# Patient Record
Sex: Female | Born: 1980
Health system: Southern US, Community
[De-identification: ages and names within clinical notes are randomized; demographics above are authoritative.]

## PROBLEM LIST (undated history)

## (undated) DIAGNOSIS — F419 Anxiety disorder, unspecified: Secondary | ICD-10-CM

## (undated) HISTORY — PX: NASAL SEPTUM SURGERY: SHX37

## (undated) HISTORY — DX: Anxiety disorder, unspecified: F41.9

## (undated) HISTORY — PX: CHOLECYSTECTOMY: SHX55

## (undated) HISTORY — PX: ANKLE SURGERY: SHX546

## (undated) HISTORY — PX: OTHER SURGICAL HISTORY: SHX169

---

## 2017-07-27 DIAGNOSIS — J029 Acute pharyngitis, unspecified: Secondary | ICD-10-CM | POA: Diagnosis not present

## 2017-09-16 DIAGNOSIS — J9801 Acute bronchospasm: Secondary | ICD-10-CM | POA: Diagnosis not present

## 2017-09-16 DIAGNOSIS — J00 Acute nasopharyngitis [common cold]: Secondary | ICD-10-CM | POA: Diagnosis not present

## 2017-09-16 DIAGNOSIS — J209 Acute bronchitis, unspecified: Secondary | ICD-10-CM | POA: Diagnosis not present

## 2017-10-20 ENCOUNTER — Ambulatory Visit (INDEPENDENT_AMBULATORY_CARE_PROVIDER_SITE_OTHER): Payer: 59 | Admitting: Obstetrics & Gynecology

## 2017-10-20 ENCOUNTER — Encounter: Payer: Self-pay | Admitting: Obstetrics & Gynecology

## 2017-10-20 VITALS — BP 119/75 | HR 100 | Resp 16 | Ht 65.0 in | Wt 168.0 lb

## 2017-10-20 DIAGNOSIS — Z01411 Encounter for gynecological examination (general) (routine) with abnormal findings: Secondary | ICD-10-CM

## 2017-10-20 DIAGNOSIS — Z23 Encounter for immunization: Secondary | ICD-10-CM

## 2017-10-20 DIAGNOSIS — Z113 Encounter for screening for infections with a predominantly sexual mode of transmission: Secondary | ICD-10-CM

## 2017-10-20 DIAGNOSIS — Z124 Encounter for screening for malignant neoplasm of cervix: Secondary | ICD-10-CM | POA: Diagnosis not present

## 2017-10-20 DIAGNOSIS — Z1151 Encounter for screening for human papillomavirus (HPV): Secondary | ICD-10-CM

## 2017-10-20 DIAGNOSIS — Z01419 Encounter for gynecological examination (general) (routine) without abnormal findings: Secondary | ICD-10-CM | POA: Diagnosis not present

## 2017-10-20 DIAGNOSIS — N938 Other specified abnormal uterine and vaginal bleeding: Secondary | ICD-10-CM

## 2017-10-20 NOTE — Progress Notes (Signed)
Subjective:    Rachel Levy is a 37 y.o. married P55 (92 year old son- Marvis Moeller)  female who presents for an annual exam. She has had bleeding most days for the last month. She has had Mirena for 3 years with essentially no bleeding until recently. The patient is sexually active. GYN screening history: last pap: was normal. The patient wears seatbelts: yes. The patient participates in regular exercise: yes. (eliptical and walking)  Has the patient ever been transfused or tattooed?: yes. The patient reports that there is not domestic violence in her life.   Menstrual History: OB History    Gravida Para Term Preterm AB Living   1 1 1          SAB TAB Ectopic Multiple Live Births                  Menarche age: 39 Patient's last menstrual period was 09/19/2017.    The following portions of the patient's history were reviewed and updated as appropriate: allergies, current medications, past family history, past medical history, past social history, past surgical history and problem list.  Review of Systems Pertinent items are noted in HPI.   Works at 09/21/2017 Married for 9 years FH- no breast/gyn/colon cancer Will need flu vaccine today   Objective:    BP 119/75   Pulse 100   Resp 16   Ht 5\' 5"  (1.651 m)   Wt 168 lb (76.2 kg)   LMP 09/19/2017   BMI 27.96 kg/m   General Appearance:    Alert, cooperative, no distress, appears stated age  Head:    Normocephalic, without obvious abnormality, atraumatic  Eyes:    PERRL, conjunctiva/corneas clear, EOM's intact, fundi    benign, both eyes  Ears:    Normal TM's and external ear canals, both ears  Nose:   Nares normal, septum midline, mucosa normal, no drainage    or sinus tenderness  Throat:   Lips, mucosa, and tongue normal; teeth and gums normal  Neck:   Supple, symmetrical, trachea midline, no adenopathy;    thyroid:  no enlargement/tenderness/nodules; no carotid   bruit or JVD  Back:     Symmetric, no curvature, ROM  normal, no CVA tenderness  Lungs:     Clear to auscultation bilaterally, respirations unlabored  Chest Wall:    No tenderness or deformity   Heart:    Regular rate and rhythm, S1 and S2 normal, no murmur, rub   or gallop  Breast Exam:    No tenderness, masses, or nipple abnormality  Abdomen:     Soft, non-tender, bowel sounds active all four quadrants,    no masses, no organomegaly  Genitalia:    Normal female without lesion, discharge or tenderness, IUD strings about 3 cm long, old brown blood noted, normal size and shape, anteverted, mobile, non-tender, normal adnexal exam      Extremities:   Extremities normal, atraumatic, no cyanosis or edema  Pulses:   2+ and symmetric all extremities  Skin:   Skin color, texture, turgor normal, no rashes or lesions  Lymph nodes:   Cervical, supraclavicular, and axillary nodes normal  Neurologic:   CNII-XII intact, normal strength, sensation and reflexes    throughout  .    Assessment:    Healthy female exam.   New onset irregular bleeding with mirena   Plan:     Thin prep Pap smear. with cotesting Check tsh, cervical cultures Check CBC, gyn ultrasound

## 2017-10-21 LAB — CBC
HEMATOCRIT: 38.9 % (ref 35.0–45.0)
Hemoglobin: 13.2 g/dL (ref 11.7–15.5)
MCH: 30.8 pg (ref 27.0–33.0)
MCHC: 33.9 g/dL (ref 32.0–36.0)
MCV: 90.7 fL (ref 80.0–100.0)
MPV: 11.2 fL (ref 7.5–12.5)
Platelets: 302 10*3/uL (ref 140–400)
RBC: 4.29 10*6/uL (ref 3.80–5.10)
RDW: 12.5 % (ref 11.0–15.0)
WBC: 9.2 10*3/uL (ref 3.8–10.8)

## 2017-10-21 LAB — TSH: TSH: 1.18 mIU/L

## 2017-10-25 LAB — CYTOLOGY - PAP
CHLAMYDIA, DNA PROBE: NEGATIVE
DIAGNOSIS: NEGATIVE
HPV: NOT DETECTED
Neisseria Gonorrhea: NEGATIVE

## 2017-10-27 ENCOUNTER — Ambulatory Visit (INDEPENDENT_AMBULATORY_CARE_PROVIDER_SITE_OTHER): Payer: 59

## 2017-10-27 DIAGNOSIS — Z975 Presence of (intrauterine) contraceptive device: Secondary | ICD-10-CM | POA: Diagnosis not present

## 2017-10-27 DIAGNOSIS — N939 Abnormal uterine and vaginal bleeding, unspecified: Secondary | ICD-10-CM | POA: Diagnosis not present

## 2017-10-27 DIAGNOSIS — N938 Other specified abnormal uterine and vaginal bleeding: Secondary | ICD-10-CM

## 2017-11-04 ENCOUNTER — Encounter: Payer: Self-pay | Admitting: Obstetrics & Gynecology

## 2017-11-17 ENCOUNTER — Ambulatory Visit (INDEPENDENT_AMBULATORY_CARE_PROVIDER_SITE_OTHER): Payer: 59 | Admitting: Obstetrics & Gynecology

## 2017-11-17 ENCOUNTER — Encounter: Payer: Self-pay | Admitting: Obstetrics & Gynecology

## 2017-11-17 VITALS — BP 121/78 | HR 66 | Wt 163.0 lb

## 2017-11-17 DIAGNOSIS — N921 Excessive and frequent menstruation with irregular cycle: Secondary | ICD-10-CM | POA: Diagnosis not present

## 2017-11-17 DIAGNOSIS — N898 Other specified noninflammatory disorders of vagina: Secondary | ICD-10-CM

## 2017-11-17 DIAGNOSIS — N939 Abnormal uterine and vaginal bleeding, unspecified: Secondary | ICD-10-CM | POA: Diagnosis not present

## 2017-11-17 DIAGNOSIS — Z975 Presence of (intrauterine) contraceptive device: Secondary | ICD-10-CM | POA: Insufficient documentation

## 2017-11-17 DIAGNOSIS — Z01812 Encounter for preprocedural laboratory examination: Secondary | ICD-10-CM

## 2017-11-17 DIAGNOSIS — Z3202 Encounter for pregnancy test, result negative: Secondary | ICD-10-CM

## 2017-11-17 DIAGNOSIS — N711 Chronic inflammatory disease of uterus: Secondary | ICD-10-CM | POA: Diagnosis not present

## 2017-11-17 LAB — POCT URINE PREGNANCY: PREG TEST UR: NEGATIVE

## 2017-11-17 MED ORDER — MEDROXYPROGESTERONE ACETATE 10 MG PO TABS: 10.0000 mg | ORAL_TABLET | Freq: Every day | ORAL | 0 refills | Status: DC

## 2017-11-17 NOTE — Progress Notes (Signed)
Pt has had Mirena for 3 years- has had bleeding for 2 months.

## 2017-11-17 NOTE — Progress Notes (Signed)
   Subjective:    Patient ID: Rachel Levy, female    DOB: 1980-11-02, 37 y.o.   MRN: [id number]  HPI  37 yo female presents for f/u of bleeding that has been daily at times.  Pt did not have this problem for hte first 3 1/4 years of having the Mirena.  No pelvic pain.  Pt frustrated.  No clots.  Pt also having a yellowish heavy discharge.  STD check at annual was negative.    Review of Systems  Constitutional: Negative.   Respiratory: Negative.   Cardiovascular: Negative.   Gastrointestinal: Negative.   Genitourinary: Positive for menstrual problem, pelvic pain, vaginal bleeding and vaginal discharge.  Psychiatric/Behavioral: Negative.        Objective:   Physical Exam  Constitutional: She is oriented to person, place, and time. She appears well-developed and well-nourished. No distress.  HENT:  Head: Normocephalic and atraumatic.  Eyes: Conjunctivae are normal.  Cardiovascular: Normal rate.  Pulmonary/Chest: Effort normal.  Abdominal: Soft. She exhibits no distension. There is no tenderness.  Genitourinary:  Genitourinary Comments: Tanner V Vulva no lesion Vagina:  Yellowish disharge, no lesion Cervix, no CMT, IUD strings seen Uterus: mobile, NT   Musculoskeletal: She exhibits no edema.  Neurological: She is alert and oriented to person, place, and time.  Skin: Skin is warm and dry.  Psychiatric: She has a normal mood and affect.  Vitals reviewed.  Vitals:   11/17/17 1110  BP: 121/78  Pulse: 66  Weight: 163 lb (73.9 kg)    Assessment & Plan:  37 yo female with bleeding ou IUD  1-US shows IUD in nml position, lining is 10 mm which is thicker than expected.   2-Endometrial biopsy to day to r/u chronic endometritis, polyp. 3-Labs nml that Dr. Marice Potter drew. 4-Provera for 14 days.  Pt will let us know if abnml bleeding continues after Provera finishes.   5-Aptima  ENDOMETRIAL BIOPSY     The indications for endometrial biopsy were reviewed.   Risks of the biopsy  including cramping, bleeding, infection, uterine perforation, inadequate specimen and need for additional procedures  were discussed. The patient states she understands and agrees to undergo procedure today. Consent was signed. Time out was performed. Urine HCG was negative. A sterile speculum was placed in the patient's vagina and the cervix was prepped with Betadine. A single-toothed tenaculum was placed on the anterior lip of the cervix to stabilize it. The 3 mm pipelle was introduced into the endometrial cavity without difficulty to a depth of 8 cm, and a moderate amount of tissue was obtained and sent to pathology. The instruments were removed from the patient's vagina. Minimal bleeding from the cervix was noted. The patient tolerated the procedure well. Routine post-procedure instructions were given to the patient. The patient will follow up to review the results and for further management.

## 2017-11-18 LAB — CERVICOVAGINAL ANCILLARY ONLY
BACTERIAL VAGINITIS: NEGATIVE
Candida vaginitis: POSITIVE — AB

## 2017-11-22 ENCOUNTER — Telehealth: Payer: Self-pay | Admitting: *Deleted

## 2017-11-22 ENCOUNTER — Other Ambulatory Visit: Payer: Self-pay | Admitting: Obstetrics & Gynecology

## 2017-11-22 MED ORDER — FLUCONAZOLE 150 MG PO TABS: 150.0000 mg | ORAL_TABLET | Freq: Every day | ORAL | 1 refills | Status: DC

## 2017-11-22 MED ORDER — DOXYCYCLINE HYCLATE 100 MG PO CAPS: 100.0000 mg | ORAL_CAPSULE | Freq: Two times a day (BID) | ORAL | 0 refills | Status: DC

## 2017-11-22 NOTE — Telephone Encounter (Signed)
Pt notified of positive yeast and her biopsy showed endometritis.  Per Dr Penne Lash patient will have RX @ [facility] for Diflucan and Doxycycline.  Pt voices understanding and I let her know she should have a message in My-Chart

## 2017-11-22 NOTE — Telephone Encounter (Signed)
-----   Message from Lesly Dukes, MD sent at 11/22/2017 11:27 AM EDT ----- Treat yeast with Diflucan.  GC/Chlam negative in Feb 2019.  Endometrial Bx shows chronic endometritis.  Will treat with Doxycycline 100 mg bid for 14 days.  Note sent to my chart and RN to call.

## 2017-11-22 NOTE — Progress Notes (Signed)
Doxycycline for chronic endometritis on biopsy.

## 2017-11-30 ENCOUNTER — Telehealth: Payer: Self-pay | Admitting: *Deleted

## 2017-11-30 NOTE — Telephone Encounter (Signed)
Received a fax request from [facility] for Provera 10x10.  I spoke with the patient today and she said she had finished the original 10 days of Provera and the antibiotic that Dr Penne Lash prescribed.  She has not had any further bleeding.  [facility] notified to not RF the Provera.  Pt will f/u accordingly

## 2018-01-13 ENCOUNTER — Encounter (HOSPITAL_COMMUNITY): Payer: Self-pay | Admitting: Emergency Medicine

## 2018-01-13 ENCOUNTER — Ambulatory Visit (HOSPITAL_COMMUNITY)
Admission: EM | Admit: 2018-01-13 | Discharge: 2018-01-13 | Disposition: A | Discharge status: Home / Self Care | Payer: 59 | Attending: Family Medicine | Admitting: Family Medicine

## 2018-01-13 DIAGNOSIS — Z833 Family history of diabetes mellitus: Secondary | ICD-10-CM | POA: Insufficient documentation

## 2018-01-13 DIAGNOSIS — Z79899 Other long term (current) drug therapy: Secondary | ICD-10-CM | POA: Diagnosis not present

## 2018-01-13 DIAGNOSIS — R05 Cough: Secondary | ICD-10-CM | POA: Diagnosis not present

## 2018-01-13 DIAGNOSIS — J029 Acute pharyngitis, unspecified: Secondary | ICD-10-CM | POA: Diagnosis not present

## 2018-01-13 DIAGNOSIS — Z880 Allergy status to penicillin: Secondary | ICD-10-CM | POA: Insufficient documentation

## 2018-01-13 LAB — POCT RAPID STREP A: Streptococcus, Group A Screen (Direct): NEGATIVE

## 2018-01-13 MED ORDER — IPRATROPIUM BROMIDE 0.06 % NA SOLN: 2.0000 | Freq: Four times a day (QID) | NASAL | 0 refills | Status: DC

## 2018-01-13 NOTE — ED Triage Notes (Signed)
Pt c/o sore throat, states someone at school had strep, pt states she gets strep a lot.

## 2018-01-13 NOTE — Discharge Instructions (Signed)
Rapid strep negative. Atrovent nasal spray for any nasal congestion/drainage. You can use over the counter nasal saline rinse such as neti pot for nasal congestion. Monitor for any worsening of symptoms, swelling of the throat, trouble breathing, trouble swallowing, follow up for reevaluation.   For sore throat try using a honey-based tea. Use 3 teaspoons of honey with juice squeezed from half lemon. Place shaved pieces of ginger into 1/2-1 cup of water and warm over stove top. Then mix the ingredients and repeat every 4 hours as needed.

## 2018-01-13 NOTE — ED Provider Notes (Signed)
[facility]    CSN: [id number] Arrival date & time: 01/13/18  1632     History   Chief Complaint Chief Complaint  Patient presents with  . Sore Throat    HPI Rachel Levy is a 37 y.o. female.   37 year old female comes in for 2-day history of sore throat.  Has had mild cough.  Denies nasal congestion, rhinorrhea.  Some painful swallowing without swelling to throat, trouble breathing, drooling, trismus, tripoding.  Denies fever, chills, night sweats.  States was exposed to strep recently.  Has not taken anything for symptoms.     History reviewed. No pertinent past medical history.  Patient Active Problem List   Diagnosis Date Noted  . Breakthrough bleeding associated with intrauterine device (IUD) 11/17/2017    Past Surgical History:  Procedure Laterality Date  . no history of surgeries      OB History    Gravida  1   Para  1   Term  1   Preterm      AB      Living        SAB      TAB      Ectopic      Multiple      Live Births               Home Medications    Prior to Admission medications   Medication Sig Start Date End Date Taking? Authorizing Provider  doxycycline (VIBRAMYCIN) 100 MG capsule Take 1 capsule (100 mg total) by mouth 2 (two) times daily. Patient not taking: Reported on 01/13/2018 11/22/17   Lesly Dukes, MD  fluconazole (DIFLUCAN) 150 MG tablet Take 1 tablet (150 mg total) by mouth daily. Patient not taking: Reported on 01/13/2018 11/22/17   Lesly Dukes, MD  ipratropium (ATROVENT) 0.06 % nasal spray Place 2 sprays into both nostrils 4 (four) times daily. 01/13/18   Belinda Fisher, PA-C  levonorgestrel (MIRENA) 20 MCG/24HR IUD 1 each by Intrauterine route once.    [provider]  medroxyPROGESTERone (PROVERA) 10 MG tablet Take 1 tablet (10 mg total) by mouth daily. 11/17/17   Lesly Dukes, MD    Family History Family History  Problem Relation Age of Onset  . Diabetes Father     Social  History Social History   Tobacco Use  . Smoking status: Never Smoker  . Smokeless tobacco: Never Used  Substance Use Topics  . Alcohol use: Yes    Comment: occassional  . Drug use: No     Allergies   Penicillins   Review of Systems Review of Systems  Reason unable to perform ROS: See HPI as above.     Physical Exam Triage Vital Signs ED Triage Vitals [01/13/18 1656]  Enc Vitals Group     BP (!) 149/91     Pulse Rate (!) 112     Resp 18     Temp 98.5 F (36.9 C)     Temp src      SpO2 100 %     Weight      Height      Head Circumference      Peak Flow      Pain Score      Pain Loc      Pain Edu?      Excl. in GC?    No data found.  Updated Vital Signs BP (!) 149/91   Pulse (!) 112   Temp  98.5 F (36.9 C)   Resp 18   SpO2 100%   Physical Exam  Constitutional: She is oriented to person, place, and time. She appears well-developed and well-nourished. No distress.  HENT:  Head: Normocephalic and atraumatic.  Right Ear: Tympanic membrane, external ear and ear canal normal. Tympanic membrane is not erythematous and not bulging.  Left Ear: Tympanic membrane, external ear and ear canal normal. Tympanic membrane is not erythematous and not bulging.  Nose: Nose normal. Right sinus exhibits no maxillary sinus tenderness and no frontal sinus tenderness. Left sinus exhibits no maxillary sinus tenderness and no frontal sinus tenderness.  Mouth/Throat: Uvula is midline, oropharynx is clear and moist and mucous membranes are normal. No tonsillar exudate.  Eyes: Pupils are equal, round, and reactive to light. Conjunctivae are normal.  Neck: Normal range of motion. Neck supple.  Cardiovascular: Normal rate, regular rhythm and normal heart sounds. Exam reveals no gallop and no friction rub.  No murmur heard. Pulmonary/Chest: Effort normal and breath sounds normal. She has no decreased breath sounds. She has no wheezes. She has no rhonchi. She has no rales.    Lymphadenopathy:    She has no cervical adenopathy.  Neurological: She is alert and oriented to person, place, and time.  Skin: Skin is warm and dry.  Psychiatric: She has a normal mood and affect. Her behavior is normal. Judgment normal.     UC Treatments / Results  Labs (all labs ordered are listed, but only abnormal results are displayed) Labs Reviewed  CULTURE, GROUP A STREP [facility])  POCT RAPID STREP A    EKG None  Radiology No results found.  Procedures Procedures (including critical care time)  Medications Ordered in UC Medications - No data to display  Initial Impression / Assessment and Plan / UC Course  I have reviewed the triage vital signs and the nursing notes.  Pertinent labs & imaging results that were available during my care of the patient were reviewed by me and considered in my medical decision making (see chart for details).    Rapid strep negative. Patient is nontoxic in appearance. Symptomatic treatment as needed. Return precautions given.   Final Clinical Impressions(s) / UC Diagnoses   Final diagnoses:  Acute pharyngitis, unspecified etiology    ED Prescriptions    Medication Sig Dispense Auth. Provider   ipratropium (ATROVENT) 0.06 % nasal spray Place 2 sprays into both nostrils 4 (four) times daily. 15 mL Threasa Alpha, [place] 01/13/18 1727

## 2018-01-16 LAB — CULTURE, GROUP A STREP (THRC)

## 2018-04-20 ENCOUNTER — Encounter: Payer: Self-pay | Admitting: Obstetrics & Gynecology

## 2018-04-20 ENCOUNTER — Ambulatory Visit (INDEPENDENT_AMBULATORY_CARE_PROVIDER_SITE_OTHER): Payer: 59 | Admitting: Obstetrics & Gynecology

## 2018-04-20 VITALS — BP 126/77 | HR 69 | Resp 16 | Ht 65.0 in | Wt 168.0 lb

## 2018-04-20 DIAGNOSIS — Z113 Encounter for screening for infections with a predominantly sexual mode of transmission: Secondary | ICD-10-CM | POA: Diagnosis not present

## 2018-04-20 DIAGNOSIS — Z30015 Encounter for initial prescription of vaginal ring hormonal contraceptive: Secondary | ICD-10-CM

## 2018-04-20 DIAGNOSIS — Z3202 Encounter for pregnancy test, result negative: Secondary | ICD-10-CM

## 2018-04-20 DIAGNOSIS — R1032 Left lower quadrant pain: Secondary | ICD-10-CM | POA: Diagnosis not present

## 2018-04-20 DIAGNOSIS — Z30432 Encounter for removal of intrauterine contraceptive device: Secondary | ICD-10-CM

## 2018-04-20 DIAGNOSIS — N920 Excessive and frequent menstruation with regular cycle: Secondary | ICD-10-CM | POA: Diagnosis not present

## 2018-04-20 DIAGNOSIS — T8389XA Other specified complication of genitourinary prosthetic devices, implants and grafts, initial encounter: Secondary | ICD-10-CM

## 2018-04-20 LAB — POCT URINE PREGNANCY: Preg Test, Ur: NEGATIVE

## 2018-04-20 MED ORDER — ETONOGESTREL-ETHINYL ESTRADIOL 0.12-0.015 MG/24HR VA RING: VAGINAL_RING | VAGINAL | 15 refills | Status: DC

## 2018-04-20 MED ORDER — TRIAMCINOLONE ACETONIDE 0.5 % EX OINT: 1.0000 "application " | TOPICAL_OINTMENT | Freq: Two times a day (BID) | CUTANEOUS | 0 refills | Status: DC

## 2018-04-20 MED ORDER — AZITHROMYCIN 250 MG PO TABS: ORAL_TABLET | ORAL | 0 refills | Status: DC

## 2018-04-20 NOTE — Progress Notes (Signed)
   Subjective:    Patient ID: Rachel Levy, female    DOB: 1981/07/30, 37 y.o.   MRN: [id number]  HPI  37 yo femae presents with one month mucous and spotting.  Pt had similar episode in March 2019 and ceared up with doxycycline.  Pt had a skin rash towrds the end of her doxycycline.  Pt also had severe LLQ pain last night htat has now resolved.  She is concerned she has chronic endometritis again and wants IUD out.  She would like to try the nuva Ring.   Pt denies any other assoicated symtpoms.  Pt has not taken any medications or treatments for bleeding or pain.   Review of Systems  Constitutional: Negative.   Respiratory: Negative.   Cardiovascular: Negative.   Gastrointestinal: Negative.   Genitourinary: Positive for menstrual problem, pelvic pain and vaginal bleeding.  Psychiatric/Behavioral: Negative.        Objective:   Physical Exam  Constitutional: She is oriented to person, place, and time. She appears well-developed and well-nourished. No distress.  HENT:  Head: Normocephalic and atraumatic.  Eyes: Conjunctivae are normal.  Cardiovascular: Normal rate.  Pulmonary/Chest: Effort normal.  Genitourinary:  Genitourinary Comments: Ext Gen:  Tanner V Vagina: pink, normal rugae Cervix:  Closed, IUD strings seen, no lesion Uterus: nml size Adnexa:  L>R, minimal pain with palpation  Musculoskeletal: She exhibits no edema.  Neurological: She is alert and oriented to person, place, and time.  Skin: Skin is warm and dry.  Psychiatric: She has a normal mood and affect.  Vitals reviewed.  Vitals:   04/20/18 1317  BP: 126/77  Pulse: 69  Resp: 16  Weight: 168 lb (76.2 kg)  Height: 5\' 5"  (1.651 m)    Assessment & Plan:   37 yo female with 2nd incident of menomet on IUD.  !st time biopsy proven chronic endometritis.  Pt wants IUD removed and Nuva Ring to start. Condoms 1st two weeks of Nuva Ring Azithro for RX (given allergic reaction to doxy) RTC if bleeding not better  or LLQ pain returns.  IUD Removal Patient identified, informed consent performed. Discussed risks of irregular bleeding, cramping, infection, malpositioning or misplacement of the IUD outside the uterus which may require further procedures. Time out was performed. Speculum placed in the vagina. Cervix visualized. The strings of the IUD were grasped and pulled using ring forceps. The IUD was successfully removed in its entirety.

## 2018-04-21 ENCOUNTER — Telehealth: Payer: Self-pay | Admitting: *Deleted

## 2018-04-21 LAB — CERVICOVAGINAL ANCILLARY ONLY
Bacterial vaginitis: NEGATIVE
CANDIDA VAGINITIS: POSITIVE — AB
CHLAMYDIA, DNA PROBE: NEGATIVE
Neisseria Gonorrhea: NEGATIVE
TRICH (WINDOWPATH): NEGATIVE

## 2018-04-21 MED ORDER — ETONOGESTREL-ETHINYL ESTRADIOL 0.12-0.015 MG/24HR VA RING: VAGINAL_RING | VAGINAL | 15 refills | Status: DC

## 2018-04-21 MED ORDER — AZITHROMYCIN 250 MG PO TABS: ORAL_TABLET | ORAL | 0 refills | Status: DC

## 2018-04-21 MED FILL — AZITHROMYCIN 250 MG TABLET: 250 | 5 days supply | Qty: 6 | Fill #0

## 2018-04-21 MED FILL — NUVARING VAGINAL RING: 0.12-0.015 | 28 days supply | Qty: 1 | Fill #0

## 2018-04-21 NOTE — Telephone Encounter (Signed)
Pt called requesting that her RX that Dr Penne Lash gave her yesterday be sent to [facility] outpatient pharmacy instead of her local pharmacy.  I have transferred those scripts to [facility] pharmacy

## 2018-04-23 ENCOUNTER — Other Ambulatory Visit: Payer: Self-pay | Admitting: Obstetrics & Gynecology

## 2018-04-23 MED ORDER — FLUCONAZOLE 150 MG PO TABS: 150.0000 mg | ORAL_TABLET | Freq: Once | ORAL | 3 refills | Status: AC

## 2018-04-23 NOTE — Progress Notes (Signed)
Rx for Yeast infection --Diflucan  ([place] pharmacy)

## 2018-05-15 MED FILL — NUVARING VAGINAL RING: 0.12-0.015 | 84 days supply | Qty: 3 | Fill #1

## 2018-05-31 ENCOUNTER — Telehealth: Payer: Self-pay

## 2018-05-31 NOTE — Telephone Encounter (Signed)
Patient states she saw that she had an abnormal results in her chart and medication was sent in . Patient made aware that she had a yeast infection and diflucan was sent in. Patient now not having symptoms since that was back in August.   Patient state she is still having bleeding since her August appointment. Made her aware that Dr. leggett noted  That she should return to clinic if she was still having bleeding. Patient made appointment with Dr Penne Lash on Sept 30th. Armandina Stammer RN

## 2018-06-05 ENCOUNTER — Ambulatory Visit (INDEPENDENT_AMBULATORY_CARE_PROVIDER_SITE_OTHER): Payer: 59 | Admitting: Obstetrics & Gynecology

## 2018-06-05 ENCOUNTER — Encounter: Payer: Self-pay | Admitting: Obstetrics & Gynecology

## 2018-06-05 VITALS — BP 126/83 | HR 67 | Resp 16 | Ht 65.0 in | Wt 172.0 lb

## 2018-06-05 DIAGNOSIS — N939 Abnormal uterine and vaginal bleeding, unspecified: Secondary | ICD-10-CM | POA: Diagnosis not present

## 2018-06-05 DIAGNOSIS — Z3202 Encounter for pregnancy test, result negative: Secondary | ICD-10-CM

## 2018-06-05 LAB — POCT URINE PREGNANCY: PREG TEST UR: NEGATIVE

## 2018-06-05 NOTE — Progress Notes (Signed)
   Subjective:    Patient ID: Rachel Levy, female    DOB: [birth year], 37 y.o.   MRN: [id number]  HPI  IUD removed 8/15.  Nuva ring inserted about 8/16 or 17.  Bleeding began 8/22ish.   Bleeding heavy and long for 2 weeks.  Then patinet took it out and replaaced around Sept 7th.  Spotting started occurring.  Occurs every few days.  Uses one panty liner a day.  Last spotting is 3 days ago.    Review of Systems  Constitutional: Negative.   Respiratory: Negative.   Cardiovascular: Negative.   Gastrointestinal: Negative.   Endocrine: Negative.   Genitourinary: Positive for menstrual problem and vaginal bleeding.  Hematological: Negative.   Psychiatric/Behavioral: Negative.        Objective:   Physical Exam  Constitutional: She is oriented to person, place, and time. She appears well-developed and well-nourished. No distress.  HENT:  Head: Normocephalic and atraumatic.  Eyes: Conjunctivae are normal.  Cardiovascular: Normal rate.  Pulmonary/Chest: Effort normal.  Abdominal: Soft. Bowel sounds are normal. She exhibits no distension and no mass. There is no tenderness. There is no rebound and no guarding.  Genitourinary: Vagina normal and uterus normal. No vaginal discharge found.  Genitourinary Comments: Cervix no lesion No CMT  Musculoskeletal: She exhibits no edema.  Neurological: She is alert and oriented to person, place, and time.  Skin: Skin is warm and dry.  Psychiatric: She has a normal mood and affect.  Vitals reviewed.  Vitals:   06/05/18 1536  BP: 126/83  Pulse: 67  Resp: 16  Weight: 172 lb (78 kg)  Height: 5\' 5"  (1.651 m)    Assessment & Plan:  37 yo female with break through bleeding on Nuva Ring.  No lesion or polyp found on exam.  1-Continue nuva ring--bleeding and spotting is normal the first 3 months of initiating hormonal birth control

## 2018-07-19 ENCOUNTER — Other Ambulatory Visit: Payer: Self-pay | Admitting: *Deleted

## 2018-07-19 MED ORDER — ETONOGESTREL-ETHINYL ESTRADIOL 0.12-0.015 MG/24HR VA RING: VAGINAL_RING | VAGINAL | 10 refills | Status: DC

## 2018-07-21 MED FILL — NUVARING VAGINAL RING: 0.12-0.015 | 28 days supply | Qty: 1 | Fill #0

## 2018-08-04 ENCOUNTER — Ambulatory Visit: Payer: Self-pay | Admitting: Family

## 2018-08-04 VITALS — BP 110/85 | HR 98 | Temp 98.3°F | Resp 16 | Wt 172.6 lb

## 2018-08-04 DIAGNOSIS — R509 Fever, unspecified: Secondary | ICD-10-CM

## 2018-08-04 DIAGNOSIS — J029 Acute pharyngitis, unspecified: Secondary | ICD-10-CM

## 2018-08-04 MED ORDER — AZITHROMYCIN 250 MG PO TABS: ORAL_TABLET | ORAL | 0 refills | Status: DC

## 2018-08-04 MED FILL — AZITHROMYCIN 250 MG TABLET: 250 | 5 days supply | Qty: 6 | Fill #0

## 2018-08-04 NOTE — Patient Instructions (Signed)

## 2018-08-04 NOTE — Progress Notes (Signed)
Subjective:     Patient ID: Rachel Levy, female   DOB: [birth year], 37 y.o.   MRN: [id number]  HPI 37 year old female is in today with sore throat, fever, body aches or sudden onset yesterday. No cough or congestion. It is painful to swallow and is very concerned because she had strep pharyngitis last year that presented itself this way. She has not taken any OTC medications.   Review of Systems  Constitutional: Positive for chills, fatigue and fever.  HENT: Positive for sore throat. Negative for congestion, postnasal drip, sinus pressure and sinus pain.   Respiratory: Negative.   Cardiovascular: Negative.   Musculoskeletal: Negative.   Skin: Negative.   Allergic/Immunologic: Negative.   Neurological: Negative.   Psychiatric/Behavioral: Negative.    No past medical history on file.  Social History   Socioeconomic History  . Marital status: Married    Spouse name: Not on file  . Number of children: Not on file  . Years of education: Not on file  . Highest education level: Not on file  Occupational History  . Occupation: receptionist  Social Needs  . Financial resource strain: Not on file  . Food insecurity:    Worry: Not on file    Inability: Not on file  . Transportation needs:    Medical: Not on file    Non-medical: Not on file  Tobacco Use  . Smoking status: Never Smoker  . Smokeless tobacco: Never Used  Substance and Sexual Activity  . Alcohol use: Yes    Comment: occassional  . Drug use: No  . Sexual activity: Yes    Partners: Male    Birth control/protection: Inserts  Lifestyle  . Physical activity:    Days per week: Not on file    Minutes per session: Not on file  . Stress: Not on file  Relationships  . Social connections:    Talks on phone: Not on file    Gets together: Not on file    Attends religious service: Not on file    Active member of club or organization: Not on file    Attends meetings of clubs or organizations: Not on file     Relationship status: Not on file  . Intimate partner violence:    Fear of current or ex partner: Not on file    Emotionally abused: Not on file    Physically abused: Not on file    Forced sexual activity: Not on file  Other Topics Concern  . Not on file  Social History Narrative  . Not on file    Past Surgical History:  Procedure Laterality Date  . no history of surgeries      Family History  Problem Relation Age of Onset  . Diabetes Father     Allergies  Allergen Reactions  . Penicillins Hives  . Doxycycline Rash    Current Outpatient Medications on File Prior to Visit  Medication Sig Dispense Refill  . etonogestrel-ethinyl estradiol (NUVARING) 0.12-0.015 MG/24HR vaginal ring Insert vaginally and leave in place for 3 consecutive weeks then replace. 1 each 10  . ibuprofen (ADVIL,MOTRIN) 200 MG tablet Take 200 mg by mouth every 6 (six) hours as needed.    . Pseudoeph-Doxylamine-DM-APAP (NYQUIL PO) Take by mouth.     No current facility-administered medications on file prior to visit.     BP 110/85 (BP Location: Right Arm, Patient Position: Sitting, Cuff Size: Normal)   Pulse 98   Temp 98.3 F (36.8 C) (Oral)  Resp 16   Wt 172 lb 9.6 oz (78.3 kg)   SpO2 98%   BMI 28.72 kg/m chart    Objective:   Physical Exam  Constitutional: She is oriented to person, place, and time. She appears well-developed and well-nourished.  HENT:  Right Ear: Tympanic membrane normal.  Left Ear: Tympanic membrane normal.  Mouth/Throat: Mucous membranes are normal. Oropharyngeal exudate, posterior oropharyngeal edema and posterior oropharyngeal erythema present. No tonsillar abscesses.  Neck: No thyromegaly present.  Cardiovascular: Normal rate, regular rhythm and normal heart sounds.  Pulmonary/Chest: Effort normal and breath sounds normal.  Lymphadenopathy:    She has cervical adenopathy.  Neurological: She is alert and oriented to person, place, and time.  Skin: Skin is warm and  dry.  Psychiatric: She has a normal mood and affect.       Assessment:     [name] was seen today for fever and sore throat.  Diagnoses and all orders for this visit:  Sore throat -     POCT rapid strep A  Fever, unspecified fever cause  Other orders -     azithromycin (ZITHROMAX) 250 MG tablet; 2 tabs today then 1 tab daily x 4 ore days.      Plan:     Ibuprofen and Aleve as needed. Call the office with any questions or concerns. Recheck as needed

## 2018-08-12 MED FILL — NUVARING VAGINAL RING: 0.12-0.015 | 28 days supply | Qty: 1 | Fill #1

## 2018-09-11 MED FILL — ETONOGESTREL-ETHINYL ESTRAD: 0.12-0.015 | 84 days supply | Qty: 3 | Fill #2

## 2018-09-14 ENCOUNTER — Ambulatory Visit: Payer: 59 | Admitting: Physician Assistant

## 2018-09-27 ENCOUNTER — Ambulatory Visit (INDEPENDENT_AMBULATORY_CARE_PROVIDER_SITE_OTHER): Payer: 59 | Admitting: Physician Assistant

## 2018-09-27 ENCOUNTER — Ambulatory Visit (INDEPENDENT_AMBULATORY_CARE_PROVIDER_SITE_OTHER): Payer: 59

## 2018-09-27 ENCOUNTER — Encounter: Payer: Self-pay | Admitting: Physician Assistant

## 2018-09-27 VITALS — BP 146/90 | HR 74 | Ht 65.0 in | Wt 175.0 lb

## 2018-09-27 DIAGNOSIS — M533 Sacrococcygeal disorders, not elsewhere classified: Secondary | ICD-10-CM

## 2018-09-27 DIAGNOSIS — Z7689 Persons encountering health services in other specified circumstances: Secondary | ICD-10-CM

## 2018-09-27 DIAGNOSIS — M25571 Pain in right ankle and joints of right foot: Secondary | ICD-10-CM

## 2018-09-27 DIAGNOSIS — M545 Low back pain, unspecified: Secondary | ICD-10-CM

## 2018-09-27 DIAGNOSIS — Z23 Encounter for immunization: Secondary | ICD-10-CM

## 2018-09-27 MED ORDER — AMBULATORY NON FORMULARY MEDICATION: 0 refills | Status: DC

## 2018-09-27 NOTE — Progress Notes (Signed)
HPI:                                                                Rachel Levy is a 38 y.o. female who [facility] today to establish care  Current concerns:   Reports trip and fall over her dog landing on vinyl floor at home about 1 month ago. She reports right-sided low back pain and right ankle pain the following day, pain is gradually improving but is still present. She initially had some right foot/toe numbness for about a week, which resolved. No prior orthopedic injuries or surgeries. She rested for about a month and returned to the gym yesterday, was able to do the elliptical for 1 hour without pain. However, when she tried to reverse directions on the elliptical she had increased back pain.  Depression screen [employer] 2/9 09/27/2018  Decreased Interest 0  Down, Depressed, Hopeless 0  PHQ - 2 Score 0  Altered sleeping 0  Tired, decreased energy 0  Change in appetite 2  Feeling bad or failure about yourself  0  Trouble concentrating 0  Moving slowly or fidgety/restless 0  Suicidal thoughts 0  PHQ-9 Score 2    No flowsheet data found.    History reviewed. No pertinent past medical history. Past Surgical History:  Procedure Laterality Date  . no history of surgeries     Social History   Tobacco Use  . Smoking status: Former Smoker    Packs/day: 0.50    Years: 12.00    Pack years: 6.00    Types: Cigarettes    Last attempt to quit: 02/12/2012    Years since quitting: 6.6  . Smokeless tobacco: Never Used  Substance Use Topics  . Alcohol use: Yes    Alcohol/week: 3.0 standard drinks    Types: 3 Standard drinks or equivalent per week   family history includes Diabetes in her father and maternal grandfather; Healthy in her sister; Hyperlipidemia in her maternal aunt, maternal uncle, and mother.    ROS: Review of Systems  All other systems reviewed and are negative.    Medications: Current Outpatient  Medications  Medication Sig Dispense Refill  . etonogestrel-ethinyl estradiol (NUVARING) 0.12-0.015 MG/24HR vaginal ring Insert vaginally and leave in place for 3 consecutive weeks then replace. 1 each 10  . ibuprofen (ADVIL,MOTRIN) 200 MG tablet Take 200 mg by mouth every 6 (six) hours as needed.    . AMBULATORY NON FORMULARY MEDICATION Electrostimulation 30 mins TID Prn for musculoskeletal pain/spasm 1 each 0   No current facility-administered medications for this visit.    Allergies  Allergen Reactions  . Penicillins Hives  . Doxycycline Rash       Objective:  BP (!) 146/90   Pulse 74   Ht 5\' 5"  (1.651 m)   Wt 175 lb (79.4 kg)   LMP 09/06/2018   BMI 29.12 kg/m  Gen:  alert, not ill-appearing, no distress, appropriate for age HEENT: head normocephalic without obvious abnormality, conjunctiva and cornea clear, trachea midline Pulm: Normal work of breathing, normal phonation, clear to auscultation bilaterally, no wheezes, rales or rhonchi CV: Normal rate, regular rhythm, s1 and s2 distinct, no murmurs, clicks or rubs  Neuro: alert and oriented x 3,  no tremor MSK: extremities atraumatic, normal gait and station Right foot: atraumatic, tenderness just posterior to the lateral malleolus, pain reproduced with plantar flexion, ankle stable, negative anterior drawer Back: atraumatic, no midline tenderness, no SI joint tenderness Skin: intact, no rashes on exposed skin, no jaundice, no cyanosis Psych: well-groomed, cooperative, good eye contact, euthymic mood, affect mood-congruent, speech is articulate, and thought processes clear and goal-directed    No results found for this or any previous visit (from the past 72 hour(s)). No results found.    Assessment and Plan: 38 y.o. female with   .Normalinda was seen today for establish care.  Diagnoses and all orders for this visit:  Encounter to establish care  Acute right ankle pain -     DG Ankle Complete Right  Acute  right-sided low back pain without sciatica -     DG Lumbar Spine Complete -     DG Sacrum/Coccyx -     AMBULATORY NON FORMULARY MEDICATION; Electrostimulation 30 mins TID Prn for musculoskeletal pain/spasm -     Ambulatory referral to Physical Therapy  Influenza vaccine needed -     Flu Vaccine QUAD 6+ mos PF IM (Fluarix Quad PF)     - Personally reviewed PMH, PSH, PFH, medications, allergies, HM - Age-appropriate cancer screening: Pap UTD, NILM, HPV negative; mammogram beginning age 11 - Influenza given today - Tdap declined - PHQ2 negative   Patient education and anticipatory guidance given Patient agrees with treatment plan Follow-up as needed if symptoms worsen or fail to improve  Levonne Hubert PA-C

## 2018-09-27 NOTE — Patient Instructions (Signed)
Acute Back Pain, Adult  Acute back pain is sudden and usually short-lived. It is often caused by an injury to the muscles and tissues in the back. The injury may result from:   A muscle or ligament getting overstretched or torn (strained). Ligaments are tissues that connect bones to each other. Lifting something improperly can cause a back strain.   Wear and tear (degeneration) of the spinal disks. Spinal disks are circular tissue that provides cushioning between the bones of the spine (vertebrae).   Twisting motions, such as while playing sports or doing yard work.   A hit to the back.   Arthritis.  You may have a physical exam, lab tests, and imaging tests to find the cause of your pain. Acute back pain usually goes away with rest and home care.  Follow these instructions at home:  Managing pain, stiffness, and swelling   Take over-the-counter and prescription medicines only as told by your health care provider.   Your health care provider may recommend applying ice during the first 24-48 hours after your pain starts. To do this:  ? Put ice in a plastic bag.  ? Place a towel between your skin and the bag.  ? Leave the ice on for 20 minutes, 2-3 times a day.   If directed, apply heat to the affected area as often as told by your health care provider. Use the heat source that your health care provider recommends, such as a moist heat pack or a heating pad.  ? Place a towel between your skin and the heat source.  ? Leave the heat on for 20-30 minutes.  ? Remove the heat if your skin turns bright red. This is especially important if you are unable to feel pain, heat, or cold. You have a greater risk of getting burned.  Activity     Do not stay in bed. Staying in bed for more than 1-2 days can delay your recovery.   Sit up and stand up straight. Avoid leaning forward when you sit, or hunching over when you stand.  ? If you work at a desk, sit close to it so you do not need to lean over. Keep your chin tucked  in. Keep your neck drawn back, and keep your elbows bent at a right angle. Your arms should look like the letter "L."  ? Sit high and close to the steering wheel when you drive. Add lower back (lumbar) support to your car seat, if needed.   Take short walks on even surfaces as soon as you are able. Try to increase the length of time you walk each day.   Do not sit, drive, or stand in one place for more than 30 minutes at a time. Sitting or standing for long periods of time can put stress on your back.   Do not drive or use heavy machinery while taking prescription pain medicine.   Use proper lifting techniques. When you bend and lift, use positions that put less stress on your back:  ? Bend your knees.  ? Keep the load close to your body.  ? Avoid twisting.   Exercise regularly as told by your health care provider. Exercising helps your back heal faster and helps prevent back injuries by keeping muscles strong and flexible.   Work with a physical therapist to make a safe exercise program, as recommended by your health care provider. Do any exercises as told by your physical therapist.  Lifestyle   Maintain   a healthy weight. Extra weight puts stress on your back and makes it difficult to have good posture.   Avoid activities or situations that make you feel anxious or stressed. Stress and anxiety increase muscle tension and can make back pain worse. Learn ways to manage anxiety and stress, such as through exercise.  General instructions   Sleep on a firm mattress in a comfortable position. Try lying on your side with your knees slightly bent. If you lie on your back, put a pillow under your knees.   Follow your treatment plan as told by your health care provider. This may include:  ? Cognitive or behavioral therapy.  ? Acupuncture or massage therapy.  ? Meditation or yoga.  Contact a health care provider if:   You have pain that is not relieved with rest or medicine.   You have increasing pain going down  into your legs or buttocks.   Your pain does not improve after 2 weeks.   You have pain at night.   You lose weight without trying.   You have a fever or chills.  Get help right away if:   You develop new bowel or bladder control problems.   You have unusual weakness or numbness in your arms or legs.   You develop nausea or vomiting.   You develop abdominal pain.   You feel faint.  Summary   Acute back pain is sudden and usually short-lived.   Use proper lifting techniques. When you bend and lift, use positions that put less stress on your back.   Take over-the-counter and prescription medicines and apply heat or ice as directed by your health care provider.  This information is not intended to replace advice given to you by your health care provider. Make sure you discuss any questions you have with your health care provider.  Document Released: 08/23/2005 Document Revised: 03/30/2018 Document Reviewed: 04/06/2017  Elsevier Interactive Patient Education  2019 [employer] Inc[employer]

## 2018-09-29 ENCOUNTER — Encounter: Payer: Self-pay | Admitting: Physician Assistant

## 2018-09-29 DIAGNOSIS — I878 Other specified disorders of veins: Secondary | ICD-10-CM | POA: Insufficient documentation

## 2018-10-01 ENCOUNTER — Encounter: Payer: Self-pay | Admitting: Physician Assistant

## 2018-10-01 DIAGNOSIS — M25571 Pain in right ankle and joints of right foot: Secondary | ICD-10-CM

## 2018-10-01 DIAGNOSIS — M545 Low back pain: Secondary | ICD-10-CM

## 2018-10-01 DIAGNOSIS — M5416 Radiculopathy, lumbar region: Secondary | ICD-10-CM | POA: Insufficient documentation

## 2018-10-01 DIAGNOSIS — M958 Other specified acquired deformities of musculoskeletal system: Secondary | ICD-10-CM | POA: Insufficient documentation

## 2019-02-07 ENCOUNTER — Ambulatory Visit (INDEPENDENT_AMBULATORY_CARE_PROVIDER_SITE_OTHER): Payer: 59 | Admitting: Sports Medicine

## 2019-02-07 ENCOUNTER — Other Ambulatory Visit: Payer: Self-pay

## 2019-02-07 ENCOUNTER — Encounter: Payer: Self-pay | Admitting: Sports Medicine

## 2019-02-07 DIAGNOSIS — M5416 Radiculopathy, lumbar region: Secondary | ICD-10-CM | POA: Diagnosis not present

## 2019-02-07 DIAGNOSIS — R2241 Localized swelling, mass and lump, right lower limb: Secondary | ICD-10-CM | POA: Diagnosis not present

## 2019-02-07 MED ORDER — PREDNISONE 50 MG PO TABS: ORAL_TABLET | ORAL | 0 refills | Status: DC

## 2019-02-07 MED FILL — predniSONE 50 MG TABS: 50 | 5 days supply | Qty: 5 | Fill #0

## 2019-02-07 NOTE — Progress Notes (Signed)
Subjective:    CC: Right leg pain  HPI: This is a pleasant 38 year old female, for the past 5 to 6 months she is had pain in her right lower leg, she feels a bit of fullness over her anterior lateral lower shin, no trauma.  On further questioning she has had significant back pain, worse with standing and Valsalva, with radiation down the leg, as well as numbness and tingling into the big toe.  Symptoms are moderate, persistent, no bowel or bladder dysfunction, saddle numbness, no constitutional symptoms.  I reviewed the past medical history, family history, social history, surgical history, and allergies today and no changes were needed.  Please see the problem list section below in epic for further details.  Past Medical History: No past medical history on file. Past Surgical History: Past Surgical History:  Procedure Laterality Date  . no history of surgeries     Social History: Social History   Socioeconomic History  . Marital status: Married    Spouse name: Not on file  . Number of children: Not on file  . Years of education: Not on file  . Highest education level: Not on file  Occupational History  . Occupation: receptionist  Social Needs  . Financial resource strain: Not on file  . Food insecurity:    Worry: Not on file    Inability: Not on file  . Transportation needs:    Medical: Not on file    Non-medical: Not on file  Tobacco Use  . Smoking status: Former Smoker    Packs/day: 0.50    Years: 12.00    Pack years: 6.00    Types: Cigarettes    Last attempt to quit: 02/12/2012    Years since quitting: 6.9  . Smokeless tobacco: Never Used  Substance and Sexual Activity  . Alcohol use: Yes    Alcohol/week: 3.0 standard drinks    Types: 3 Standard drinks or equivalent per week  . Drug use: Never  . Sexual activity: Yes    Partners: Male    Birth control/protection: Inserts  Lifestyle  . Physical activity:    Days per week: Not on file    Minutes per session:  Not on file  . Stress: Not on file  Relationships  . Social connections:    Talks on phone: Not on file    Gets together: Not on file    Attends religious service: Not on file    Active member of club or organization: Not on file    Attends meetings of clubs or organizations: Not on file    Relationship status: Not on file  Other Topics Concern  . Not on file  Social History Narrative  . Not on file   Family History: Family History  Problem Relation Age of Onset  . Diabetes Father   . Hyperlipidemia Mother   . Healthy Sister   . Hyperlipidemia Maternal Aunt   . Hyperlipidemia Maternal Uncle   . Diabetes Maternal Grandfather    Allergies: Allergies  Allergen Reactions  . Penicillins Hives  . Doxycycline Rash   Medications: See med rec.  Review of Systems: No fevers, chills, night sweats, weight loss, chest pain, or shortness of breath.   Objective:    General: Well Developed, well nourished, and in no acute distress.  Neuro: Alert and oriented x3, extra-ocular muscles intact, sensation grossly intact.  HEENT: Normocephalic, atraumatic, pupils equal round reactive to light, neck supple, no masses, no lymphadenopathy, thyroid nonpalpable.  Skin: Warm and  dry, no rashes. Cardiac: Regular rate and rhythm, no murmurs rubs or gallops, no lower extremity edema.  Respiratory: Clear to auscultation bilaterally. Not using accessory muscles, speaking in full sentences. Back Exam:  Inspection: Unremarkable  Motion: Flexion 45 deg, Extension 45 deg, Side Bending to 45 deg bilaterally,  Rotation to 45 deg bilaterally  SLR laying: Negative  XSLR laying: Negative  Palpable tenderness: None. FABER: negative. Sensory change: Gross sensation intact to all lumbar and sacral dermatomes.  Reflexes: 2+ at both patellar tendons, 2+ at achilles tendons, Babinski's downgoing.  Strength at foot  Plantar-flexion: 5/5 Dorsi-flexion: 5/5 Eversion: 5/5 Inversion: 5/5  Leg strength  Quad: 5/5  Hamstring: 5/5 Hip flexor: 5/5 Hip abductors: 5/5  Gait unremarkable. There is a slight fullness over the right anterior lateral lower shin, it is not movable with dorsiflexion and plantar flexion, I think there may be a small lipoma here.  Impression and Recommendations:    Mass of right lower leg I do feel a subcutaneous mass, does not appear to move with the muscle suggesting it is not subfascial. Because there is tenderness, x-ray is unremarkable we are going to proceed with an MRI with contrast. Because of this tenderness I am also suspecting a right lumbar radiculitis.  Right lumbar radiculitis I think this is the prominent cause of the discomfort in the right lower leg. She has back pain, discogenic with radiation down the right leg to the great toe consistent with an L4 radiculitis. Adding 5 days of prednisone. Proceeding with MRI of the lumbar spine.   ___________________________________________ Ihor Austin. Benjamin Stain, M.D., ABFM., CAQSM. Primary Care and Sports Medicine  [facility]  Adjunct Professor of Family Medicine  [employer]

## 2019-02-07 NOTE — Assessment & Plan Note (Signed)
I do feel a subcutaneous mass, does not appear to move with the muscle suggesting it is not subfascial. Because there is tenderness, x-ray is unremarkable we are going to proceed with an MRI with contrast. Because of this tenderness I am also suspecting a right lumbar radiculitis.

## 2019-02-07 NOTE — Assessment & Plan Note (Signed)
I think this is the prominent cause of the discomfort in the right lower leg. She has back pain, discogenic with radiation down the right leg to the great toe consistent with an L4 radiculitis. Adding 5 days of prednisone. Proceeding with MRI of the lumbar spine.

## 2019-02-08 ENCOUNTER — Encounter: Payer: Self-pay | Admitting: Physician Assistant

## 2019-02-08 ENCOUNTER — Telehealth (INDEPENDENT_AMBULATORY_CARE_PROVIDER_SITE_OTHER): Payer: 59 | Admitting: Physician Assistant

## 2019-02-08 DIAGNOSIS — F411 Generalized anxiety disorder: Secondary | ICD-10-CM | POA: Diagnosis not present

## 2019-02-08 DIAGNOSIS — R030 Elevated blood-pressure reading, without diagnosis of hypertension: Secondary | ICD-10-CM | POA: Diagnosis not present

## 2019-02-08 DIAGNOSIS — Z0000 Encounter for general adult medical examination without abnormal findings: Secondary | ICD-10-CM | POA: Insufficient documentation

## 2019-02-08 DIAGNOSIS — Z30011 Encounter for initial prescription of contraceptive pills: Secondary | ICD-10-CM

## 2019-02-08 MED ORDER — PAROXETINE HCL 10 MG PO TABS: ORAL_TABLET | ORAL | 1 refills | Status: DC

## 2019-02-08 MED ORDER — DESOGESTREL-ETHINYL ESTRADIOL 0.15-0.02/0.01 MG (21/5) PO TABS: 1.0000 | ORAL_TABLET | Freq: Every day | ORAL | 0 refills | Status: DC

## 2019-02-08 MED ORDER — ALPRAZOLAM 0.25 MG PO TABS: 0.2500 mg | ORAL_TABLET | Freq: Two times a day (BID) | ORAL | 0 refills | Status: DC | PRN

## 2019-02-08 MED FILL — ALPRAZolam 0.25 MG TABS: 0.25 | 10 days supply | Qty: 20 | Fill #0

## 2019-02-08 MED FILL — PARoxetine HCL 10 MG TABS: 10 | 30 days supply | Qty: 30 | Fill #0

## 2019-02-08 MED FILL — DESOGESTR-ETH ESTRAD ETH ES: 0.15-0.02/0 | 84 days supply | Qty: 84 | Fill #0

## 2019-02-08 NOTE — Progress Notes (Signed)
Virtual Visit via Video Note  I connected with Rachel Levy on 02/08/19 at  9:50 AM EDT by a video enabled telemedicine application and verified that I am speaking with the correct person using two identifiers.   I discussed the limitations of evaluation and management by telemedicine and the availability of in person appointments. The patient expressed understanding and agreed to proceed.  History of Present Illness: HPI:                                                                Rachel Levy is a 38 y.o. female   CC: anxiety  Onset: childhood  Worsened around the time of the birth of her son 5 years ago States symptoms have gotten out of hand recently in the last 3 months and are affecting her work and social relationships Reports feeling on the brink of panic attacks where she becomes short of breath and has heart racing Denies depressed mood or anhedonia. Self-discontinued OCP about 2.5 months ago to see if this would help and it did not She took an antidepressant (thinks it was Paxil) in her early 20's for panic attacks  She is also interested in restarting birth control for the purpose of contraception.  She reports that she liked the Mirena IUD past, but it had to be removed due to recurrent pelvic infections.  She self discontinued NuvaRing due to polyphasia, low libido, and mood changes.  She would like to start a combination oral contraceptive pill if possible.  She does have a history of migraines without aura.  Blood pressure has been elevated at several office visits as well, but she does not have a documented history of hypertension.  Depression screen [facility] 2/9 02/08/2019 09/27/2018  Decreased Interest 0 0  Down, Depressed, Hopeless 0 0  PHQ - 2 Score 0 0  Altered sleeping 2 0  Tired, decreased energy 2 0  Change in appetite 0 2  Feeling bad or failure about yourself  0 0  Trouble concentrating 0 0  Moving slowly or fidgety/restless 0 0  Suicidal thoughts 0 0   PHQ-9 Score 4 2    GAD 7 : Generalized Anxiety Score 02/08/2019  Nervous, Anxious, on Edge 3  Control/stop worrying 2  Worry too much - different things 2  Trouble relaxing 2  Restless 2  Easily annoyed or irritable 3  Afraid - awful might happen 0  Total GAD 7 Score 14      No past medical history on file. Past Surgical History:  Procedure Laterality Date  . no history of surgeries     Social History   Tobacco Use  . Smoking status: Former Smoker    Packs/day: 0.50    Years: 12.00    Pack years: 6.00    Types: Cigarettes    Last attempt to quit: 02/12/2012    Years since quitting: 6.9  . Smokeless tobacco: Never Used  Substance Use Topics  . Alcohol use: Yes    Alcohol/week: 3.0 standard drinks    Types: 3 Standard drinks or equivalent per week   family history includes Diabetes in her father and maternal grandfather; Healthy in her sister; Hyperlipidemia in her maternal aunt, maternal uncle, and mother.    ROS: negative except  as noted in the HPI  Medications: Current Outpatient Medications  Medication Sig Dispense Refill  . predniSONE (DELTASONE) 50 MG tablet One tab PO daily for 5 days. 5 tablet 0   No current facility-administered medications for this visit.    Allergies  Allergen Reactions  . Penicillins Hives  . Doxycycline Rash       Objective:  There were no vitals taken for this visit. Gen:  alert, not ill-appearing, no distress, appropriate for age HEENT: head normocephalic without obvious abnormality, conjunctiva and cornea clear, wearing glasses, Neuro: alert and oriented x 3 Psych: cooperative, euthymic mood, affect mood-congruent, speech is articulate, normal rate and volume; thought processes clear and goal-directed, normal judgment, good insight   BP Readings from Last 3 Encounters:  02/07/19 134/86  09/27/18 (!) 146/90  08/04/18 110/85     No results found for this or any previous visit (from the past 72 hour(s)). No results  found.    Assessment and Plan: 38 y.o. female with   .Cannon was seen today for medication management.  Diagnoses and all orders for this visit:  GAD (generalized anxiety disorder) -     PARoxetine (PAXIL) 10 MG tablet; Take 0.5 tablets (5 mg total) by mouth daily for 3 days, THEN 1 tablet (10 mg total) daily for 27 days. -     ALPRAZolam (XANAX) 0.25 MG tablet; Take 1 tablet (0.25 mg total) by mouth 2 (two) times daily as needed for anxiety (panic).  Encounter for initial prescription of contraceptive pills -     desogestrel-ethinyl estradiol (MIRCETTE) 0.15-0.02/0.01 MG (21/5) tablet; Take 1 tablet by mouth daily.    Generalized anxiety disorder PHQ 2 equals 0, gad 7 equals 14, moderate Starting low-dose Paxil since she has tolerated this in the past. Counseled to self titrate to 10 mg Low-dose alprazolam 0.25 mg twice daily as needed for breakthrough anxiety/panic attacks.  Counseled on risk of sedation and to avoid driving on this medication   Counseled patient on the risks of combination hormonal contraceptive use in women over the age of 71 with comorbidities including migraine and hypertension.  Counseled on the increased stroke risk.  Safest options for her would be the Nexplanon and minipill.  She is interested in combination hormonal birth control pill at this time. She was previously using Nuvaring without complication.  Elevated BP reading Advised her to purchase a home blood pressure cuff and monitor and log her blood pressures at home twice daily at least 4 days a week. Also recommended that she start baby aspirin for primary prevention for as long as she is taking birth control  Follow-up in 1 month or sooner as needed   Follow Up Instructions:    I discussed the assessment and treatment plan with the patient. The patient was provided an opportunity to ask questions and all were answered. The patient agreed with the plan and demonstrated an understanding of the  instructions.   The patient was advised to call back or seek an in-person evaluation if the symptoms worsen or if the condition fails to improve as anticipated.  I provided 15 minutes of non-face-to-face time during this encounter.   Carlis Stable, [place]

## 2019-02-19 ENCOUNTER — Ambulatory Visit (INDEPENDENT_AMBULATORY_CARE_PROVIDER_SITE_OTHER): Payer: 59

## 2019-02-19 ENCOUNTER — Other Ambulatory Visit: Payer: Self-pay

## 2019-02-19 DIAGNOSIS — R2241 Localized swelling, mass and lump, right lower limb: Secondary | ICD-10-CM | POA: Diagnosis not present

## 2019-02-19 DIAGNOSIS — M5416 Radiculopathy, lumbar region: Secondary | ICD-10-CM

## 2019-02-19 DIAGNOSIS — M545 Low back pain: Secondary | ICD-10-CM | POA: Diagnosis not present

## 2019-02-19 DIAGNOSIS — M85861 Other specified disorders of bone density and structure, right lower leg: Secondary | ICD-10-CM | POA: Diagnosis not present

## 2019-02-19 MED ORDER — GADOBUTROL 1 MMOL/ML IV SOLN
8.0000 mL | Freq: Once | INTRAVENOUS | Status: AC | PRN
  Administered 2019-02-19: 8 mL via INTRAVENOUS

## 2019-02-21 ENCOUNTER — Encounter: Payer: Self-pay | Admitting: Sports Medicine

## 2019-02-21 ENCOUNTER — Ambulatory Visit (INDEPENDENT_AMBULATORY_CARE_PROVIDER_SITE_OTHER): Payer: 59 | Admitting: Sports Medicine

## 2019-02-21 DIAGNOSIS — M958 Other specified acquired deformities of musculoskeletal system: Secondary | ICD-10-CM

## 2019-02-21 DIAGNOSIS — M5416 Radiculopathy, lumbar region: Secondary | ICD-10-CM | POA: Diagnosis not present

## 2019-02-21 NOTE — Progress Notes (Addendum)
Subjective:    CC: MRI follow-up  HPI: This is a very pleasant 38 year old female, we have been treating her for right lumbar radiculopathy, as well as a right tib-fib mass.  Lumbar spine MRI showed a right L3-L4 herniated disc acting the exiting right L3 nerve root, she had no evidence of a mass along her shin but she was found to have an OCD of her medial talar dome on the right.  We did a burst of prednisone, and her back pain as well as radicular symptoms to the right have resolved completely.  On further questioning she does have pain in her ankle, moderate, persistent, localized without radiation.  Worse with prolonged weightbearing, steps.  No history of trauma.  I reviewed the past medical history, family history, social history, surgical history, and allergies today and no changes were needed.  Please see the problem list section below in epic for further details.  Past Medical History: No past medical history on file. Past Surgical History: Past Surgical History:  Procedure Laterality Date  . no history of surgeries     Social History: Social History   Socioeconomic History  . Marital status: Married    Spouse name: Not on file  . Number of children: Not on file  . Years of education: Not on file  . Highest education level: Not on file  Occupational History  . Occupation: receptionist  Social Needs  . Financial resource strain: Not on file  . Food insecurity    Worry: Not on file    Inability: Not on file  . Transportation needs    Medical: Not on file    Non-medical: Not on file  Tobacco Use  . Smoking status: Former Smoker    Packs/day: 0.50    Years: 12.00    Pack years: 6.00    Types: Cigarettes    Quit date: 02/12/2012    Years since quitting: 7.0  . Smokeless tobacco: Never Used  Substance and Sexual Activity  . Alcohol use: Yes    Alcohol/week: 3.0 standard drinks    Types: 3 Standard drinks or equivalent per week  . Drug use: Never  . Sexual  activity: Yes    Partners: Male    Birth control/protection: Inserts  Lifestyle  . Physical activity    Days per week: Not on file    Minutes per session: Not on file  . Stress: Not on file  Relationships  . Social Herbalist on phone: Not on file    Gets together: Not on file    Attends religious service: Not on file    Active member of club or organization: Not on file    Attends meetings of clubs or organizations: Not on file    Relationship status: Not on file  Other Topics Concern  . Not on file  Social History Narrative  . Not on file   Family History: Family History  Problem Relation Age of Onset  . Diabetes Father   . Hyperlipidemia Mother   . Healthy Sister   . Hyperlipidemia Maternal Aunt   . Hyperlipidemia Maternal Uncle   . Diabetes Maternal Grandfather    Allergies: Allergies  Allergen Reactions  . Penicillins Hives  . Doxycycline Rash  . Nuvaring [Etonogestrel-Ethinyl Estradiol] Other (See Comments)    Polyphagia, low libido, mood changes   Medications: See med rec.  Review of Systems: No fevers, chills, night sweats, weight loss, chest pain, or shortness of breath.   Objective:  General: Well Developed, well nourished, and in no acute distress.  Neuro: Alert and oriented x3, extra-ocular muscles intact, sensation grossly intact.  HEENT: Normocephalic, atraumatic, pupils equal round reactive to light, neck supple, no masses, no lymphadenopathy, thyroid nonpalpable.  Skin: Warm and dry, no rashes. Cardiac: Regular rate and rhythm, no murmurs rubs or gallops, no lower extremity edema.  Respiratory: Clear to auscultation bilaterally. Not using accessory muscles, speaking in full sentences. Right ankle: No visible erythema or swelling. Range of motion is full in all directions. Strength is 5/5 in all directions. Stable lateral and medial ligaments; squeeze test and kleiger test unremarkable; Talar dome nontender; No pain at base of 5th  MT; No tenderness over cuboid; No tenderness over N spot or navicular prominence No tenderness on posterior aspects of lateral and medial malleolus No sign of peroneal tendon subluxations; Negative tarsal tunnel tinel's Able to walk 4 steps.  Procedure: Real-time Ultrasound Guided injection of the right ankle joint Device: GE Logiq E  Verbal informed consent obtained.  Time-out conducted.  Noted no overlying erythema, induration, or other signs of local infection.  Skin prepped in a sterile fashion.  Local anesthesia: Topical Ethyl chloride.  With sterile technique and under real time ultrasound guidance:  25-gauge needle advanced between the tibia and the talus, I then injected 1 cc Kenalog 40, 1 cc lidocaine, 1 cc bupivacaine. Completed without difficulty  Pain immediately resolved suggesting accurate placement of the medication.  Advised to call if fevers/chills, erythema, induration, drainage, or persistent bleeding.  Images permanently stored and available for review in the ultrasound unit.  Impression: Technically successful ultrasound guided injection.  Impression and Recommendations:    Osteochondral defect of talus OCD medial talus. Injection as above. Return to see me in 1 month, dedicated MRI and referral to orthopedic foot and ankle surgery if no better.  Right lumbar radiculitis MRI showed a right L3 nerve compression. Symptoms resolved after prednisone.   ___________________________________________ Ihor Austin. Benjamin Stain, M.D., ABFM., CAQSM. Primary Care and Sports Medicine [facility]  Adjunct Professor of Family Medicine  [employer]

## 2019-02-21 NOTE — Assessment & Plan Note (Signed)
OCD medial talus. Injection as above. Return to see me in 1 month, dedicated MRI and referral to orthopedic foot and ankle surgery if no better.

## 2019-02-21 NOTE — Assessment & Plan Note (Signed)
MRI showed a right L3 nerve compression. Symptoms resolved after prednisone.

## 2019-03-05 MED FILL — PARoxetine HCL 10 MG TABS: 10 | 30 days supply | Qty: 30 | Fill #1

## 2019-03-15 ENCOUNTER — Ambulatory Visit (INDEPENDENT_AMBULATORY_CARE_PROVIDER_SITE_OTHER): Payer: 59 | Admitting: Physician Assistant

## 2019-03-15 ENCOUNTER — Other Ambulatory Visit: Payer: Self-pay

## 2019-03-15 ENCOUNTER — Encounter: Payer: Self-pay | Admitting: Physician Assistant

## 2019-03-15 VITALS — BP 146/101 | HR 90 | Temp 98.1°F | Wt 183.0 lb

## 2019-03-15 DIAGNOSIS — Z30011 Encounter for initial prescription of contraceptive pills: Secondary | ICD-10-CM | POA: Diagnosis not present

## 2019-03-15 DIAGNOSIS — I10 Essential (primary) hypertension: Secondary | ICD-10-CM

## 2019-03-15 DIAGNOSIS — F411 Generalized anxiety disorder: Secondary | ICD-10-CM

## 2019-03-15 MED ORDER — NORETHIN ACE-ETH ESTRAD-FE 1-20 MG-MCG PO TABS: 1.0000 | ORAL_TABLET | Freq: Every day | ORAL | 4 refills | Status: DC

## 2019-03-15 MED ORDER — PAROXETINE HCL 10 MG PO TABS: 10.0000 mg | ORAL_TABLET | Freq: Every day | ORAL | 0 refills | Status: DC

## 2019-03-15 MED FILL — BLISOVI FE 1/20 1-20 MG-MCG: 1-20 | 84 days supply | Qty: 84 | Fill #0

## 2019-03-15 NOTE — Patient Instructions (Addendum)
Try taking Paxil in the evening (supper time or right at bedtime) Limit sleep to no more than 9 hours to prevent excessive daytime sleepiness  For your blood pressure: - Goal <130/80 (Ideally 120's/70's) - monitor and log blood pressures at home (at least once a day) - check around the same time each day in a relaxed setting - Limit salt to <2500 mg/day - Follow DASH (Dietary Approach to Stopping Hypertension) eating plan - Try to get at least 150 minutes of aerobic exercise per week - Aim to go on a brisk walk 30 minutes per day at least 5 days per week. If you're not active, gradually increase how long you walk by 5 minutes each week - limit alcohol: 2 standard drinks per day for men and 1 per day for women - avoid tobacco/nicotine products. Consider smoking cessation if you smoke - weight loss: 7% of current body weight can reduce your blood pressure by 5-10 points Wait to start new birth control pill Send MyChart message with home readings in 1 week   DASH Eating Plan DASH stands for "Dietary Approaches to Stop Hypertension." The DASH eating plan is a healthy eating plan that has been shown to reduce high blood pressure (hypertension). It may also reduce your risk for type 2 diabetes, heart disease, and stroke. The DASH eating plan may also help with weight loss. What are tips for following this plan?  General guidelines  Avoid eating more than 2,300 mg (milligrams) of salt (sodium) a day. If you have hypertension, you may need to reduce your sodium intake to 1,500 mg a day.  Limit alcohol intake to no more than 1 drink a day for nonpregnant women and 2 drinks a day for men. One drink equals 12 oz of beer, 5 oz of wine, or 1 oz of hard liquor.  Work with your health care provider to maintain a healthy body weight or to lose weight. Ask what an ideal weight is for you.  Get at least 30 minutes of exercise that causes your heart to beat faster (aerobic exercise) most days of the  week. Activities may include walking, swimming, or biking.  Work with your health care provider or diet and nutrition specialist (dietitian) to adjust your eating plan to your individual calorie needs. Reading food labels   Check food labels for the amount of sodium per serving. Choose foods with less than 5 percent of the Daily Value of sodium. Generally, foods with less than 300 mg of sodium per serving fit into this eating plan.  To find whole grains, look for the word "whole" as the first word in the ingredient list. Shopping  Buy products labeled as "low-sodium" or "no salt added."  Buy fresh foods. Avoid canned foods and premade or frozen meals. Cooking  Avoid adding salt when cooking. Use salt-free seasonings or herbs instead of table salt or sea salt. Check with your health care provider or pharmacist before using salt substitutes.  Do not fry foods. Cook foods using healthy methods such as baking, boiling, grilling, and broiling instead.  Cook with heart-healthy oils, such as olive, canola, soybean, or sunflower oil. Meal planning  Eat a balanced diet that includes: ? 5 or more servings of fruits and vegetables each day. At each meal, try to fill half of your plate with fruits and vegetables. ? Up to 6-8 servings of whole grains each day. ? Less than 6 oz of lean meat, poultry, or fish each day. A 3-oz serving  of meat is about the same size as a deck of cards. One egg equals 1 oz. ? 2 servings of low-fat dairy each day. ? A serving of nuts, seeds, or beans 5 times each week. ? Heart-healthy fats. Healthy fats called Omega-3 fatty acids are found in foods such as flaxseeds and coldwater fish, like sardines, salmon, and mackerel.  Limit how much you eat of the following: ? Canned or prepackaged foods. ? Food that is high in trans fat, such as fried foods. ? Food that is high in saturated fat, such as fatty meat. ? Sweets, desserts, sugary drinks, and other foods with added  sugar. ? Full-fat dairy products.  Do not salt foods before eating.  Try to eat at least 2 vegetarian meals each week.  Eat more home-cooked food and less restaurant, buffet, and fast food.  When eating at a restaurant, ask that your food be prepared with less salt or no salt, if possible. What foods are recommended? The items listed may not be a complete list. Talk with your dietitian about what dietary choices are best for you. Grains Whole-grain or whole-wheat bread. Whole-grain or whole-wheat pasta. Brown rice. [place]. Bulgur. Whole-grain and low-sodium cereals. Pita bread. Low-fat, low-sodium crackers. Whole-wheat flour tortillas. Vegetables Fresh or frozen vegetables (raw, steamed, roasted, or grilled). Low-sodium or reduced-sodium tomato and vegetable juice. Low-sodium or reduced-sodium tomato sauce and tomato paste. Low-sodium or reduced-sodium canned vegetables. Fruits All fresh, dried, or frozen fruit. Canned fruit in natural juice (without added sugar). Meat and other protein foods Skinless chicken or [place]. Ground chicken or [place]. Pork with fat trimmed off. Fish and seafood. Egg whites. Dried beans, peas, or lentils. Unsalted nuts, nut butters, and seeds. Unsalted canned beans. Lean cuts of beef with fat trimmed off. Low-sodium, lean deli meat. Dairy Low-fat (1%) or fat-free (skim) milk. Fat-free, low-fat, or reduced-fat cheeses. Nonfat, low-sodium ricotta or cottage cheese. Low-fat or nonfat yogurt. Low-fat, low-sodium cheese. Fats and oils Soft margarine without trans fats. Vegetable oil. Low-fat, reduced-fat, or light mayonnaise and salad dressings (reduced-sodium). Canola, safflower, olive, soybean, and sunflower oils. Avocado. Seasoning and other foods Herbs. Spices. Seasoning mixes without salt. Unsalted popcorn and pretzels. Fat-free sweets. What foods are not recommended? The items listed may not be a complete list. Talk with your dietitian about what  dietary choices are best for you. Grains Baked goods made with fat, such as croissants, muffins, or some breads. Dry pasta or rice meal packs. Vegetables Creamed or fried vegetables. Vegetables in a cheese sauce. Regular canned vegetables (not low-sodium or reduced-sodium). Regular canned tomato sauce and paste (not low-sodium or reduced-sodium). Regular tomato and vegetable juice (not low-sodium or reduced-sodium). Angie Fava. Olives. Fruits Canned fruit in a light or heavy syrup. Fried fruit. Fruit in cream or butter sauce. Meat and other protein foods Fatty cuts of meat. Ribs. Fried meat. Berniece Salines. Sausage. Bologna and other processed lunch meats. Salami. Fatback. Hotdogs. Bratwurst. Salted nuts and seeds. Canned beans with added salt. Canned or smoked fish. Whole eggs or egg yolks. Chicken or [place] with skin. Dairy Whole or 2% milk, cream, and half-and-half. Whole or full-fat cream cheese. Whole-fat or sweetened yogurt. Full-fat cheese. Nondairy creamers. Whipped toppings. Processed cheese and cheese spreads. Fats and oils Butter. Stick margarine. Lard. Shortening. Ghee. Bacon fat. Tropical oils, such as coconut, palm kernel, or palm oil. Seasoning and other foods Salted popcorn and pretzels. Onion salt, garlic salt, seasoned salt, table salt, and sea salt. Worcestershire sauce. Tartar sauce. Barbecue sauce.  Teriyaki sauce. Soy sauce, including reduced-sodium. Steak sauce. Canned and packaged gravies. Fish sauce. Oyster sauce. Cocktail sauce. Horseradish that you find on the shelf. Ketchup. Mustard. Meat flavorings and tenderizers. Bouillon cubes. Hot sauce and Tabasco sauce. Premade or packaged marinades. Premade or packaged taco seasonings. Relishes. Regular salad dressings. Where to find more information:  National Heart, Lung, and [place]: https://wilson-eaton.com/  American Heart Association: www.heart.org Summary  The DASH eating plan is a healthy eating plan that has been shown to reduce  high blood pressure (hypertension). It may also reduce your risk for type 2 diabetes, heart disease, and stroke.  With the DASH eating plan, you should limit salt (sodium) intake to 2,300 mg a day. If you have hypertension, you may need to reduce your sodium intake to 1,500 mg a day.  When on the DASH eating plan, aim to eat more fresh fruits and vegetables, whole grains, lean proteins, low-fat dairy, and heart-healthy fats.  Work with your health care provider or diet and nutrition specialist (dietitian) to adjust your eating plan to your individual calorie needs. This information is not intended to replace advice given to you by your health care provider. Make sure you discuss any questions you have with your health care provider. Document Released: 08/12/2011 Document Revised: 08/05/2017 Document Reviewed: 08/16/2016 Elsevier Patient Education  2020 [employer].

## 2019-03-15 NOTE — Progress Notes (Signed)
HPI:                                                                Rachel Levy is a 38 y.o. female who presents to [name]: Primary Care Sports Medicine today for anxiety follow-up  02/08/19 Anxiety Onset: childhood  Worsened around the time of the birth of her son 5 years ago States symptoms have gotten out of hand recently in the last 3 months and are affecting her work and social relationships Reports feeling on the brink of panic attacks where she becomes short of breath and has heart racing Denies depressed mood or anhedonia. Self-discontinued OCP about 2.5 months ago to see if this would help and it did not She took an antidepressant (thinks it was Paxil) in her early 20's for panic attacks  She is also interested in restarting birth control for the purpose of contraception.  She reports that she liked the Mirena IUD past, but it had to be removed due to recurrent pelvic infections.  She self discontinued NuvaRing due to polyphasia, low libido, and mood changes.  She would like to start a combination oral contraceptive pill if possible.  She does have a history of migraines without aura.  Blood pressure has been elevated at several office visits as well, but she does not have a documented history of hypertension.  Interval hx 03/15/19 She was restarted on low-dose Paxil 5 mg daily, she self titrated to 10 mg and has been tolerating this well.  She was also given low-dose alprazolam 0.25 mg and has only needed to take this on 3 occasions.  She states "my OCD is better... Medication has made it easier to deal with work-related stress."  She admits she still feels anxious most days.  She also feels more tired/sleepy than usual.  She states she typically sleeps 8 to 9 hours per night but since starting the medication has been sleeping approximately 10 hours nightly.  As far as the oral contraceptive, she states that she took Mircette for about 2 weeks and had extreme  tiredness, nausea, vomiting so she discontinued it  She monitored her BP at home only twice 132/82 112/76 Denies any associated headache, dizziness, lightheadedness, vision change, new or worsening edema, exertional chest pain.  Depression screen [facility] 2/9 03/15/2019 02/08/2019 09/27/2018  Decreased Interest 1 0 0  Down, Depressed, Hopeless 0 0 0  PHQ - 2 Score 1 0 0  Altered sleeping 1 2 0  Tired, decreased energy 2 2 0  Change in appetite 0 0 2  Feeling bad or failure about yourself  0 0 0  Trouble concentrating 0 0 0  Moving slowly or fidgety/restless 0 0 0  Suicidal thoughts 0 0 0  PHQ-9 Score 4 4 2     GAD 7 : Generalized Anxiety Score 03/15/2019 02/08/2019  Nervous, Anxious, on Edge 2 3  Control/stop worrying 0 2  Worry too much - different things 0 2  Trouble relaxing 1 2  Restless 0 2  Easily annoyed or irritable 1 3  Afraid - awful might happen 0 0  Total GAD 7 Score 4 14      No past medical history on file. Past Surgical History:  Procedure Laterality Date  . no  history of surgeries     Social History   Tobacco Use  . Smoking status: Former Smoker    Packs/day: 0.50    Years: 12.00    Pack years: 6.00    Types: Cigarettes    Quit date: 02/12/2012    Years since quitting: 7.0  . Smokeless tobacco: Never Used  Substance Use Topics  . Alcohol use: Yes    Alcohol/week: 3.0 standard drinks    Types: 3 Standard drinks or equivalent per week   family history includes Diabetes in her father and maternal grandfather; Healthy in her sister; Hyperlipidemia in her maternal aunt, maternal uncle, and mother.    ROS: negative except as noted in the HPI  Medications: Current Outpatient Medications  Medication Sig Dispense Refill  . ALPRAZolam (XANAX) 0.25 MG tablet Take 1 tablet (0.25 mg total) by mouth 2 (two) times daily as needed for anxiety (panic). 20 tablet 0  . PARoxetine (PAXIL) 10 MG tablet Take 0.5 tablets (5 mg total) by mouth daily for 3 days, THEN 1 tablet  (10 mg total) daily for 27 days. 30 tablet 1   No current facility-administered medications for this visit.    Allergies  Allergen Reactions  . Penicillins Hives  . Doxycycline Rash  . Nuvaring [Etonogestrel-Ethinyl Estradiol] Other (See Comments)    Polyphagia, low libido, mood changes       Objective:  BP (!) 146/88   Pulse 90   Temp 98.1 F (36.7 C) (Oral)   Wt 183 lb (83 kg)   BMI 30.45 kg/m  Vitals:   03/15/19 1349 03/15/19 1406  BP: (!) 146/88 (!) 146/101  Pulse: 90   Temp: 98.1 F (36.7 C)    BP Readings from Last 3 Encounters:  03/15/19 (!) 146/101  02/21/19 (!) 143/81  02/07/19 134/86    Gen:  alert, not ill-appearing, no distress, appropriate for age 50: head normocephalic without obvious abnormality, conjunctiva and cornea clear, wearing glasses, trachea midline Pulm: Normal work of breathing, normal phonation, clear to auscultation bilaterally, no wheezes, rales or rhonchi CV: Normal rate, regular rhythm, s1 and s2 distinct, no murmurs, clicks or rubs  Neuro: alert and oriented x 3, no tremor MSK: extremities atraumatic, normal gait and station, no peripheral edema Skin: intact, no rashes on exposed skin, no jaundice, no cyanosis Psych: cooperative, "anxious" mood, affect mood-congruent, speech is articulate, normal rate and volume; thought processes clear and goal-directed, normal judgment, good insight    No results found for this or any previous visit (from the past 72 hour(s)). No results found.    Assessment and Plan: 38 y.o. female with   .Lysandra was seen today for follow-up.  Diagnoses and all orders for this visit:  GAD (generalized anxiety disorder) -     PARoxetine (PAXIL) 10 MG tablet; Take 1 tablet (10 mg total) by mouth at bedtime.  Hypertension goal BP (blood pressure) < 130/80  Encounter for initial prescription of contraceptive pills -     norethindrone-ethinyl estradiol (LOESTRIN FE) 1-20 MG-MCG tablet; Take 1 tablet by  mouth daily.  Generalized anxiety disorder Gad 7 equals 4, improved from 14 Partial response to Paxil 10 mg She is having some hypersomnia, so will not increase dose at this time. Instructed patient to limit sleep to 9 hours nightly and try taking Paxil in the evening Follow-up in 1 month  Hypertension Blood pressure in stage II hypertensive range in office today, asymptomatic She only self monitor blood pressure at home on 2 occasions  one reading was normotensive, other reading was in stage I hypertensive range I have instructed her not to start the new oral contraceptive She will monitor blood pressure at home daily for the next week and send a MyChart message If blood pressure remains out of range and she wishes to start oral contraception she will need to start antihypertensive medication, especially since she is over 35.  Patient expressed understanding  Contraception Intolerance with NuvaRing and Mircette Hold off on starting new medication until blood pressure is controlled Consider minipill  Patient education and anticipatory guidance given Patient agrees with treatment plan Follow-up in 1 month (E visit or office visit) or sooner as needed if symptoms worsen or fail to improve  Levonne Hubert PA-C

## 2019-03-22 ENCOUNTER — Other Ambulatory Visit: Payer: Self-pay

## 2019-03-22 ENCOUNTER — Encounter: Payer: Self-pay | Admitting: Sports Medicine

## 2019-03-22 ENCOUNTER — Ambulatory Visit (INDEPENDENT_AMBULATORY_CARE_PROVIDER_SITE_OTHER): Payer: 59 | Admitting: Sports Medicine

## 2019-03-22 DIAGNOSIS — M958 Other specified acquired deformities of musculoskeletal system: Secondary | ICD-10-CM | POA: Diagnosis not present

## 2019-03-22 NOTE — Assessment & Plan Note (Signed)
Incidentally noted on shin MRI. We did an ankle joint injection for a medial talar osteochondral defect and she returns today for the most part pain-free. If recurrence of symptoms we would likely need a dedicated ankle MRI and referral to orthopedic foot and ankle surgery.

## 2019-03-22 NOTE — Progress Notes (Signed)
Subjective:    CC: Follow-up  HPI: Right ankle pain: Resolved.  I reviewed the past medical history, family history, social history, surgical history, and allergies today and no changes were needed.  Please see the problem list section below in epic for further details.  Past Medical History: No past medical history on file. Past Surgical History: Past Surgical History:  Procedure Laterality Date  . no history of surgeries     Social History: Social History   Socioeconomic History  . Marital status: Married    Spouse name: Not on file  . Number of children: Not on file  . Years of education: Not on file  . Highest education level: Not on file  Occupational History  . Occupation: receptionist  Social Needs  . Financial resource strain: Not on file  . Food insecurity    Worry: Not on file    Inability: Not on file  . Transportation needs    Medical: Not on file    Non-medical: Not on file  Tobacco Use  . Smoking status: Former Smoker    Packs/day: 0.50    Years: 12.00    Pack years: 6.00    Types: Cigarettes    Quit date: 02/12/2012    Years since quitting: 7.1  . Smokeless tobacco: Never Used  Substance and Sexual Activity  . Alcohol use: Yes    Alcohol/week: 3.0 standard drinks    Types: 3 Standard drinks or equivalent per week  . Drug use: Never  . Sexual activity: Yes    Partners: Male    Birth control/protection: Inserts  Lifestyle  . Physical activity    Days per week: Not on file    Minutes per session: Not on file  . Stress: Not on file  Relationships  . Social Musician on phone: Not on file    Gets together: Not on file    Attends religious service: Not on file    Active member of club or organization: Not on file    Attends meetings of clubs or organizations: Not on file    Relationship status: Not on file  Other Topics Concern  . Not on file  Social History Narrative  . Not on file   Family History: Family History  Problem  Relation Age of Onset  . Diabetes Father   . Hyperlipidemia Mother   . Healthy Sister   . Hyperlipidemia Maternal Aunt   . Hyperlipidemia Maternal Uncle   . Diabetes Maternal Grandfather    Allergies: Allergies  Allergen Reactions  . Penicillins Hives  . Doxycycline Rash  . Mircette [Desogestrel-Ethinyl Estradiol] Nausea And Vomiting  . Nuvaring [Etonogestrel-Ethinyl Estradiol] Other (See Comments)    Polyphagia, low libido, mood changes   Medications: See med rec.  Review of Systems: No fevers, chills, night sweats, weight loss, chest pain, or shortness of breath.   Objective:    General: Well Developed, well nourished, and in no acute distress.  Neuro: Alert and oriented x3, extra-ocular muscles intact, sensation grossly intact.  HEENT: Normocephalic, atraumatic, pupils equal round reactive to light, neck supple, no masses, no lymphadenopathy, thyroid nonpalpable.  Skin: Warm and dry, no rashes. Cardiac: Regular rate and rhythm, no murmurs rubs or gallops, no lower extremity edema.  Respiratory: Clear to auscultation bilaterally. Not using accessory muscles, speaking in full sentences. Right ankle: No visible erythema or swelling. Range of motion is full in all directions. Strength is 5/5 in all directions. Stable lateral and medial ligaments;  squeeze test and kleiger test unremarkable; Talar dome nontender; No pain at base of 5th MT; No tenderness over cuboid; No tenderness over N spot or navicular prominence No tenderness on posterior aspects of lateral and medial malleolus No sign of peroneal tendon subluxations; Negative tarsal tunnel tinel's Able to walk 4 steps.  Impression and Recommendations:    Osteochondral defect of talus Incidentally noted on shin MRI. We did an ankle joint injection for a medial talar osteochondral defect and she returns today for the most part pain-free. If recurrence of symptoms we would likely need a dedicated ankle MRI and referral to  orthopedic foot and ankle surgery.    ___________________________________________ Gwen Her. Dianah Field, M.D., ABFM., CAQSM. Primary Care and Sports Medicine [facility]  Adjunct Professor of [place] [employer] [facility]

## 2019-04-06 ENCOUNTER — Other Ambulatory Visit: Payer: Self-pay | Admitting: Physician Assistant

## 2019-04-06 DIAGNOSIS — F411 Generalized anxiety disorder: Secondary | ICD-10-CM

## 2019-04-09 ENCOUNTER — Other Ambulatory Visit: Payer: Self-pay | Admitting: Physician Assistant

## 2019-04-09 DIAGNOSIS — F411 Generalized anxiety disorder: Secondary | ICD-10-CM

## 2019-04-09 MED FILL — PARoxetine HCL 10 MG TABS: 10 | 30 days supply | Qty: 30 | Fill #0

## 2019-04-12 ENCOUNTER — Encounter: Payer: Self-pay | Admitting: Physician Assistant

## 2019-04-19 ENCOUNTER — Encounter: Payer: Self-pay | Admitting: Physician Assistant

## 2019-04-19 ENCOUNTER — Ambulatory Visit (INDEPENDENT_AMBULATORY_CARE_PROVIDER_SITE_OTHER): Payer: 59 | Admitting: Physician Assistant

## 2019-04-19 ENCOUNTER — Other Ambulatory Visit: Payer: Self-pay

## 2019-04-19 VITALS — BP 144/89 | HR 73 | Temp 98.1°F | Wt 185.0 lb

## 2019-04-19 DIAGNOSIS — Z13220 Encounter for screening for lipoid disorders: Secondary | ICD-10-CM

## 2019-04-19 DIAGNOSIS — Z3041 Encounter for surveillance of contraceptive pills: Secondary | ICD-10-CM | POA: Diagnosis not present

## 2019-04-19 DIAGNOSIS — I10 Essential (primary) hypertension: Secondary | ICD-10-CM | POA: Diagnosis not present

## 2019-04-19 DIAGNOSIS — J342 Deviated nasal septum: Secondary | ICD-10-CM

## 2019-04-19 DIAGNOSIS — Z131 Encounter for screening for diabetes mellitus: Secondary | ICD-10-CM | POA: Diagnosis not present

## 2019-04-19 DIAGNOSIS — Z130 Encounter for screening for diseases of the blood and blood-forming organs and certain disorders involving the immune mechanism: Secondary | ICD-10-CM | POA: Diagnosis not present

## 2019-04-19 DIAGNOSIS — F411 Generalized anxiety disorder: Secondary | ICD-10-CM | POA: Diagnosis not present

## 2019-04-19 DIAGNOSIS — Z1389 Encounter for screening for other disorder: Secondary | ICD-10-CM | POA: Diagnosis not present

## 2019-04-19 DIAGNOSIS — H04201 Unspecified epiphora, right lacrimal gland: Secondary | ICD-10-CM | POA: Diagnosis not present

## 2019-04-19 MED ORDER — AMLODIPINE BESYLATE 5 MG PO TABS: 5.0000 mg | ORAL_TABLET | Freq: Every day | ORAL | 1 refills | Status: DC

## 2019-04-19 MED ORDER — ALPRAZOLAM 0.25 MG PO TABS: 0.2500 mg | ORAL_TABLET | Freq: Two times a day (BID) | ORAL | 2 refills | Status: DC | PRN

## 2019-04-19 MED ORDER — PAROXETINE HCL 10 MG PO TABS: 10.0000 mg | ORAL_TABLET | Freq: Every day | ORAL | 1 refills | Status: DC

## 2019-04-19 MED FILL — ALPRAZolam 0.25 MG TABS: 0.25 | 10 days supply | Qty: 20 | Fill #0

## 2019-04-19 MED FILL — AMLODIPINE BESYLATE 5 MG TA: 5 | 90 days supply | Qty: 90 | Fill #0

## 2019-04-19 NOTE — Patient Instructions (Addendum)
For your blood pressure: - Goal <130/80 (Ideally 120's/70's) - take your blood pressure medication in the morning (unless instructed differently) - monitor and log blood pressures at home - check around the same time each day in a relaxed setting - Limit salt to <2500 mg/day - Follow DASH (Dietary Approach to Stopping Hypertension) eating plan - Try to get at least 150 minutes of aerobic exercise per week - Aim to go on a brisk walk 30 minutes per day at least 5 days per week. If you're not active, gradually increase how long you walk by 5 minutes each week - limit alcohol: 2 standard drinks per day for men and 1 per day for women - avoid tobacco/nicotine products. Consider smoking cessation if you smoke - weight loss: 7% of current body weight can reduce your blood pressure by 5-10 points - follow-up at least every 6 months for your blood pressure. Follow-up sooner if your BP is not controlled - send MyChart message in 2 weeks with home BP readings  DASH Eating Plan DASH stands for "Dietary Approaches to Stop Hypertension." The DASH eating plan is a healthy eating plan that has been shown to reduce high blood pressure (hypertension). It may also reduce your risk for type 2 diabetes, heart disease, and stroke. The DASH eating plan may also help with weight loss. What are tips for following this plan?  General guidelines  Avoid eating more than 2,300 mg (milligrams) of salt (sodium) a day. If you have hypertension, you may need to reduce your sodium intake to 1,500 mg a day.  Limit alcohol intake to no more than 1 drink a day for nonpregnant women and 2 drinks a day for men. One drink equals 12 oz of beer, 5 oz of wine, or 1 oz of hard liquor.  Work with your health care provider to maintain a healthy body weight or to lose weight. Ask what an ideal weight is for you.  Get at least 30 minutes of exercise that causes your heart to beat faster (aerobic exercise) most days of the week.  Activities may include walking, swimming, or biking.  Work with your health care provider or diet and nutrition specialist (dietitian) to adjust your eating plan to your individual calorie needs. Reading food labels   Check food labels for the amount of sodium per serving. Choose foods with less than 5 percent of the Daily Value of sodium. Generally, foods with less than 300 mg of sodium per serving fit into this eating plan.  To find whole grains, look for the word "whole" as the first word in the ingredient list. Shopping  Buy products labeled as "low-sodium" or "no salt added."  Buy fresh foods. Avoid canned foods and premade or frozen meals. Cooking  Avoid adding salt when cooking. Use salt-free seasonings or herbs instead of table salt or sea salt. Check with your health care provider or pharmacist before using salt substitutes.  Do not fry foods. Cook foods using healthy methods such as baking, boiling, grilling, and broiling instead.  Cook with heart-healthy oils, such as olive, canola, soybean, or sunflower oil. Meal planning  Eat a balanced diet that includes: ? 5 or more servings of fruits and vegetables each day. At each meal, try to fill half of your plate with fruits and vegetables. ? Up to 6-8 servings of whole grains each day. ? Less than 6 oz of lean meat, poultry, or fish each day. A 3-oz serving of meat is about the same  size as a deck of cards. One egg equals 1 oz. ? 2 servings of low-fat dairy each day. ? A serving of nuts, seeds, or beans 5 times each week. ? Heart-healthy fats. Healthy fats called Omega-3 fatty acids are found in foods such as flaxseeds and coldwater fish, like sardines, salmon, and mackerel.  Limit how much you eat of the following: ? Canned or prepackaged foods. ? Food that is high in trans fat, such as fried foods. ? Food that is high in saturated fat, such as fatty meat. ? Sweets, desserts, sugary drinks, and other foods with added sugar.  ? Full-fat dairy products.  Do not salt foods before eating.  Try to eat at least 2 vegetarian meals each week.  Eat more home-cooked food and less restaurant, buffet, and fast food.  When eating at a restaurant, ask that your food be prepared with less salt or no salt, if possible. What foods are recommended? The items listed may not be a complete list. Talk with your dietitian about what dietary choices are best for you. Grains Whole-grain or whole-wheat bread. Whole-grain or whole-wheat pasta. Brown rice. [place]. Bulgur. Whole-grain and low-sodium cereals. Pita bread. Low-fat, low-sodium crackers. Whole-wheat flour tortillas. Vegetables Fresh or frozen vegetables (raw, steamed, roasted, or grilled). Low-sodium or reduced-sodium tomato and vegetable juice. Low-sodium or reduced-sodium tomato sauce and tomato paste. Low-sodium or reduced-sodium canned vegetables. Fruits All fresh, dried, or frozen fruit. Canned fruit in natural juice (without added sugar). Meat and other protein foods Skinless chicken or [place]. Ground chicken or [place]. Pork with fat trimmed off. Fish and seafood. Egg whites. Dried beans, peas, or lentils. Unsalted nuts, nut butters, and seeds. Unsalted canned beans. Lean cuts of beef with fat trimmed off. Low-sodium, lean deli meat. Dairy Low-fat (1%) or fat-free (skim) milk. Fat-free, low-fat, or reduced-fat cheeses. Nonfat, low-sodium ricotta or cottage cheese. Low-fat or nonfat yogurt. Low-fat, low-sodium cheese. Fats and oils Soft margarine without trans fats. Vegetable oil. Low-fat, reduced-fat, or light mayonnaise and salad dressings (reduced-sodium). Canola, safflower, olive, soybean, and sunflower oils. Avocado. Seasoning and other foods Herbs. Spices. Seasoning mixes without salt. Unsalted popcorn and pretzels. Fat-free sweets. What foods are not recommended? The items listed may not be a complete list. Talk with your dietitian about what dietary  choices are best for you. Grains Baked goods made with fat, such as croissants, muffins, or some breads. Dry pasta or rice meal packs. Vegetables Creamed or fried vegetables. Vegetables in a cheese sauce. Regular canned vegetables (not low-sodium or reduced-sodium). Regular canned tomato sauce and paste (not low-sodium or reduced-sodium). Regular tomato and vegetable juice (not low-sodium or reduced-sodium). Angie Fava. Olives. Fruits Canned fruit in a light or heavy syrup. Fried fruit. Fruit in cream or butter sauce. Meat and other protein foods Fatty cuts of meat. Ribs. Fried meat. Berniece Salines. Sausage. Bologna and other processed lunch meats. Salami. Fatback. Hotdogs. Bratwurst. Salted nuts and seeds. Canned beans with added salt. Canned or smoked fish. Whole eggs or egg yolks. Chicken or [place] with skin. Dairy Whole or 2% milk, cream, and half-and-half. Whole or full-fat cream cheese. Whole-fat or sweetened yogurt. Full-fat cheese. Nondairy creamers. Whipped toppings. Processed cheese and cheese spreads. Fats and oils Butter. Stick margarine. Lard. Shortening. Ghee. Bacon fat. Tropical oils, such as coconut, palm kernel, or palm oil. Seasoning and other foods Salted popcorn and pretzels. Onion salt, garlic salt, seasoned salt, table salt, and sea salt. Worcestershire sauce. Tartar sauce. Barbecue sauce. Teriyaki sauce. Soy sauce, including reduced-sodium.  Steak sauce. Canned and packaged gravies. Fish sauce. Oyster sauce. Cocktail sauce. Horseradish that you find on the shelf. Ketchup. Mustard. Meat flavorings and tenderizers. Bouillon cubes. Hot sauce and Tabasco sauce. Premade or packaged marinades. Premade or packaged taco seasonings. Relishes. Regular salad dressings. Where to find more information:  National Heart, Lung, and Blood Institute: PopSteam.is  American Heart Association: www.heart.org Summary  The DASH eating plan is a healthy eating plan that has been shown to reduce high  blood pressure (hypertension). It may also reduce your risk for type 2 diabetes, heart disease, and stroke.  With the DASH eating plan, you should limit salt (sodium) intake to 2,300 mg a day. If you have hypertension, you may need to reduce your sodium intake to 1,500 mg a day.  When on the DASH eating plan, aim to eat more fresh fruits and vegetables, whole grains, lean proteins, low-fat dairy, and heart-healthy fats.  Work with your health care provider or diet and nutrition specialist (dietitian) to adjust your eating plan to your individual calorie needs. This information is not intended to replace advice given to you by your health care provider. Make sure you discuss any questions you have with your health care provider. Document Released: 08/12/2011 Document Revised: 08/05/2017 Document Reviewed: 08/16/2016 Elsevier Patient Education  2020 [employer].

## 2019-04-19 NOTE — Progress Notes (Signed)
HPI:                                                                Rachel Levy is a 38 y.o. female who presents to [facility]: Primary Care Sports Medicine today for medication management  OCP: reports birth control is working great. She was switched from Mircette to Loestrin. No mood changes or adverse effects.  HTN: has been monitoring BP at home. Checks BP's at home. BP range 118-141/82-100. Denies vision change, headache, chest pain with exertion, orthopnea, lightheadedness, syncope and edema. Risk factors include: obesity  R eye watering for approx 1 week. Has a foreign body sensation in her right nostril for about 6 weeks and has had difficulty breathing through her right nostril. There is no nasal drainage/rhinorrhea, sneezing, or cough. She has tried OTC antihistamine without any change. No hx of environmental allergies.   Depression/Anxiety: taking Paxil 10 mg and Alprazolam 0.25 mg prn. Reports this is working well for her. She uses alprazolam sparingly about 3 days per week. She admits to increased stress at home due to living with parents. Denies symptoms of mania/hypomania. Denies suicidal thinking. Denies auditory/visual hallucinations.  Depression screen [facility] 2/9 04/19/2019 03/15/2019 02/08/2019 09/27/2018  Decreased Interest 1 1 0 0  Down, Depressed, Hopeless 1 0 0 0  PHQ - 2 Score 2 1 0 0  Altered sleeping 0 1 2 0  Tired, decreased energy 0 2 2 0  Change in appetite 1 0 0 2  Feeling bad or failure about yourself  0 0 0 0  Trouble concentrating 0 0 0 0  Moving slowly or fidgety/restless 0 0 0 0  Suicidal thoughts 0 0 0 0  PHQ-9 Score 3 4 4 2   Difficult doing work/chores Not difficult at all - - -    GAD 7 : Generalized Anxiety Score 04/19/2019 03/15/2019 02/08/2019  Nervous, Anxious, on Edge 1 2 3   Control/stop worrying 0 0 2  Worry too much - different things 0 0 2  Trouble relaxing 0 1 2  Restless 0 0 2  Easily annoyed or irritable 1 1 3   Afraid -  awful might happen 0 0 0  Total GAD 7 Score 2 4 14   Anxiety Difficulty Somewhat difficult - -      History reviewed. No pertinent past medical history. Past Surgical History:  Procedure Laterality Date  . no history of surgeries     Social History   Tobacco Use  . Smoking status: Former Smoker    Packs/day: 0.50    Years: 12.00    Pack years: 6.00    Types: Cigarettes    Quit date: 02/12/2012    Years since quitting: 7.1  . Smokeless tobacco: Never Used  Substance Use Topics  . Alcohol use: Yes    Alcohol/week: 3.0 standard drinks    Types: 3 Standard drinks or equivalent per week   family history includes Diabetes in her father and maternal grandfather; Healthy in her sister; Hyperlipidemia in her maternal aunt, maternal uncle, and mother.    ROS: negative except as noted in the HPI  Medications: Current Outpatient Medications  Medication Sig Dispense Refill  . ALPRAZolam (XANAX) 0.25 MG tablet Take 1 tablet (0.25 mg total) by mouth 2 (  two) times daily as needed for anxiety (panic). 20 tablet 2  . norethindrone-ethinyl estradiol (LOESTRIN FE) 1-20 MG-MCG tablet Take 1 tablet by mouth daily. 3 Package 4  . PARoxetine (PAXIL) 10 MG tablet Take 1 tablet (10 mg total) by mouth daily. 90 tablet 1  . amLODipine (NORVASC) 5 MG tablet Take 1 tablet (5 mg total) by mouth daily. 90 tablet 1   No current facility-administered medications for this visit.    Allergies  Allergen Reactions  . Penicillins Hives  . Doxycycline Rash  . Mircette [Desogestrel-Ethinyl Estradiol] Nausea And Vomiting  . Nuvaring [Etonogestrel-Ethinyl Estradiol] Other (See Comments)    Polyphagia, low libido, mood changes       Objective:  BP (!) 144/89   Pulse 73   Temp 98.1 F (36.7 C) (Oral)   Wt 185 lb (83.9 kg)   BMI 30.79 kg/m   Vitals:   04/19/19 0950 04/19/19 0955  BP: (!) 154/101 (!) 144/89  Pulse: 72 73  Temp: 98.1 F (36.7 C)    Wt Readings from Last 3 Encounters:  04/19/19  185 lb (83.9 kg)  03/22/19 185 lb (83.9 kg)  03/15/19 183 lb (83 kg)   Temp Readings from Last 3 Encounters:  04/19/19 98.1 F (36.7 C) (Oral)  03/15/19 98.1 F (36.7 C) (Oral)  08/04/18 98.3 F (36.8 C) (Oral)   BP Readings from Last 3 Encounters:  04/19/19 (!) 144/89  03/22/19 (!) 141/84  03/15/19 (!) 146/101   Pulse Readings from Last 3 Encounters:  04/19/19 73  03/22/19 84  03/15/19 90    Gen:  alert, not ill-appearing, no distress, appropriate for age, obese female HEENT: head normocephalic without obvious abnormality, conjunctiva and cornea clear, wearing glasses, PERRL, deviated nasal septum, nares patent, nasal mucosa pink, no sinus tenderness, oropharynx clear, neck supple, no cervical adenopathy, trachea midline Pulm: Normal work of breathing, normal phonation, clear to auscultation bilaterally, no wheezes, rales or rhonchi CV: Normal rate, regular rhythm, s1 and s2 distinct, no murmurs, clicks or rubs  Neuro: alert and oriented x 3, no tremor MSK: extremities atraumatic, normal gait and station, no peripheral edema, distal pulses intact Skin: intact, no rashes on exposed skin, no jaundice, no cyanosis Psych: well-groomed, cooperative, good eye contact, depressed mood, affect is not mood-congruent, speech is articulate, and thought processes clear and goal-directed    No results found for this or any previous visit (from the past 72 hour(s)). No results found.    Assessment and Plan: 38 y.o. female with   .Rachel Levy was seen today for follow-up.  Diagnoses and all orders for this visit:  Hypertension goal BP (blood pressure) < 130/80 -     amLODipine (NORVASC) 5 MG tablet; Take 1 tablet (5 mg total) by mouth daily. -     COMPLETE METABOLIC PANEL WITH GFR -     TSH + free T4 -     CBC -     Lipid Panel w/reflex Direct LDL -     Urinalysis, Routine w reflex microscopic  GAD (generalized anxiety disorder) -     ALPRAZolam (XANAX) 0.25 MG tablet; Take 1 tablet  (0.25 mg total) by mouth 2 (two) times daily as needed for anxiety (panic). -     PARoxetine (PAXIL) 10 MG tablet; Take 1 tablet (10 mg total) by mouth daily.  Screening for lipid disorders -     Lipid Panel w/reflex Direct LDL  Screening for diabetes mellitus -     COMPLETE METABOLIC PANEL WITH  GFR  Screening for blood disease -     CBC  Screening for blood or protein in urine -     Urinalysis, Routine w reflex microscopic  Deviated nasal septum -     Ambulatory referral to ENT  Epiphora, right  Oral contraceptive pill surveillance   HTN She does not have significant CVD risk factors, but is taking OCP and over age 86 with migraine w/o aura, so we will start antihypertensive medication CMP, lipids, TSH and UA pending Start Amlodipine 5 mg QD Continue to monitor and log BP at home. Send readings via MyChart in 2 weeks  Deviated nasal septum She is having breathing difficult and fb sensation, so we will refer to ENT to r/o nasal polyp  Epiphora Start OTC eye lubricant  Anxiety Well controlled Cont Paxil Cont Alprazolam 0.25 mg prn (#20 every 45 days)  OCP Cont to monitor BP as above to goal <130/80  Patient education and anticipatory guidance given Patient agrees with treatment plan Follow-up in 3 months for medication mgmt or sooner as needed if symptoms worsen or fail to improve  Levonne Hubert PA-C

## 2019-04-20 LAB — URINALYSIS, ROUTINE W REFLEX MICROSCOPIC
Bacteria, UA: NONE SEEN /HPF
Bilirubin Urine: NEGATIVE
Glucose, UA: NEGATIVE
Hgb urine dipstick: NEGATIVE
Hyaline Cast: NONE SEEN /LPF
Ketones, ur: NEGATIVE
Nitrite: NEGATIVE
Protein, ur: NEGATIVE
RBC / HPF: NONE SEEN /HPF (ref 0–2)
Specific Gravity, Urine: 1.018 (ref 1.001–1.03)
pH: 5.5 (ref 5.0–8.0)

## 2019-04-20 LAB — CBC
HCT: 39 % (ref 35.0–45.0)
Hemoglobin: 13.2 g/dL (ref 11.7–15.5)
MCH: 31.4 pg (ref 27.0–33.0)
MCHC: 33.8 g/dL (ref 32.0–36.0)
MCV: 92.6 fL (ref 80.0–100.0)
MPV: 11 fL (ref 7.5–12.5)
Platelets: 278 10*3/uL (ref 140–400)
RBC: 4.21 10*6/uL (ref 3.80–5.10)
RDW: 12.1 % (ref 11.0–15.0)
WBC: 8.5 10*3/uL (ref 3.8–10.8)

## 2019-04-20 LAB — COMPLETE METABOLIC PANEL WITH GFR
AG Ratio: 1.5 (calc) (ref 1.0–2.5)
ALT: 13 U/L (ref 6–29)
AST: 16 U/L (ref 10–30)
Albumin: 4.3 g/dL (ref 3.6–5.1)
Alkaline phosphatase (APISO): 49 U/L (ref 31–125)
BUN: 11 mg/dL (ref 7–25)
CO2: 25 mmol/L (ref 20–32)
Calcium: 9.5 mg/dL (ref 8.6–10.2)
Chloride: 106 mmol/L (ref 98–110)
Creat: 0.76 mg/dL (ref 0.50–1.10)
GFR, Est African American: 115 mL/min/{1.73_m2} (ref >=60)
GFR, Est Non African American: 100 mL/min/{1.73_m2} (ref >=60)
Globulin: 2.8 g/dL (calc) (ref 1.9–3.7)
Glucose, Bld: 89 mg/dL (ref 65–99)
Potassium: 4.1 mmol/L (ref 3.5–5.3)
Sodium: 139 mmol/L (ref 135–146)
Total Bilirubin: 0.4 mg/dL (ref 0.2–1.2)
Total Protein: 7.1 g/dL (ref 6.1–8.1)

## 2019-04-20 LAB — LIPID PANEL W/REFLEX DIRECT LDL
Cholesterol: 222 mg/dL — ABNORMAL HIGH (ref <200)
HDL: 60 mg/dL (ref >=50)
LDL Cholesterol (Calc): 138 mg/dL (calc) — ABNORMAL HIGH
Non-HDL Cholesterol (Calc): 162 mg/dL (calc) — ABNORMAL HIGH (ref <130)
Total CHOL/HDL Ratio: 3.7 (calc) (ref <5.0)
Triglycerides: 118 mg/dL (ref <150)

## 2019-04-20 LAB — TSH+FREE T4: TSH W/REFLEX TO FT4: 1.51 mIU/L

## 2019-05-03 DIAGNOSIS — J343 Hypertrophy of nasal turbinates: Secondary | ICD-10-CM | POA: Diagnosis not present

## 2019-05-03 DIAGNOSIS — J342 Deviated nasal septum: Secondary | ICD-10-CM | POA: Diagnosis not present

## 2019-05-03 DIAGNOSIS — M779 Enthesopathy, unspecified: Secondary | ICD-10-CM | POA: Diagnosis not present

## 2019-05-03 MED FILL — PARoxetine HCL 10 MG TABS: 10 | 90 days supply | Qty: 90 | Fill #0

## 2019-05-03 MED FILL — FLUTICASONE PROP 50 MCG SPR: 50 | 30 days supply | Qty: 16 | Fill #0

## 2019-05-03 MED FILL — METHYLPREDNISOLONE 4 MG TAB: 4 | 6 days supply | Qty: 21 | Fill #0

## 2019-05-17 MED FILL — BLISOVI FE 1/20 1-20 MG-MCG: 1-20 | 28 days supply | Qty: 28 | Fill #1

## 2019-05-30 DIAGNOSIS — J342 Deviated nasal septum: Secondary | ICD-10-CM | POA: Diagnosis not present

## 2019-05-30 DIAGNOSIS — J343 Hypertrophy of nasal turbinates: Secondary | ICD-10-CM | POA: Diagnosis not present

## 2019-05-30 DIAGNOSIS — M779 Enthesopathy, unspecified: Secondary | ICD-10-CM | POA: Diagnosis not present

## 2019-05-31 ENCOUNTER — Telehealth: Payer: Self-pay | Admitting: Family Medicine

## 2019-05-31 NOTE — Telephone Encounter (Signed)
Received medical record fax from [facility].  Patient has nasal obstruction thought to be due to septal deviation.  She failed conservative management.  We will proceed with surgery.  Note will be sent to scan.

## 2019-06-11 MED FILL — ALPRAZolam 0.25 MG TABS: 0.25 | 10 days supply | Qty: 20 | Fill #1

## 2019-06-13 MED FILL — BLISOVI FE 1/20 1-20 MG-MCG: 1-20 | 28 days supply | Qty: 28 | Fill #2

## 2019-06-25 DIAGNOSIS — Z1159 Encounter for screening for other viral diseases: Secondary | ICD-10-CM | POA: Diagnosis not present

## 2019-06-29 DIAGNOSIS — J342 Deviated nasal septum: Secondary | ICD-10-CM | POA: Diagnosis not present

## 2019-06-29 DIAGNOSIS — J343 Hypertrophy of nasal turbinates: Secondary | ICD-10-CM | POA: Diagnosis not present

## 2019-06-29 MED FILL — HYDROCODON-APAP 7.5-325: 7.5-325 | 4 days supply | Qty: 20 | Fill #0

## 2019-07-12 MED FILL — BLISOVI FE 1/20 1-20 MG-MCG: 1-20 | 28 days supply | Qty: 28 | Fill #3

## 2019-07-18 ENCOUNTER — Ambulatory Visit (INDEPENDENT_AMBULATORY_CARE_PROVIDER_SITE_OTHER): Payer: 59 | Admitting: Osteopathic Medicine

## 2019-07-18 ENCOUNTER — Other Ambulatory Visit: Payer: Self-pay

## 2019-07-18 ENCOUNTER — Encounter: Payer: Self-pay | Admitting: Osteopathic Medicine

## 2019-07-18 VITALS — BP 138/95 | HR 84 | Temp 98.2°F | Wt 188.1 lb

## 2019-07-18 DIAGNOSIS — K219 Gastro-esophageal reflux disease without esophagitis: Secondary | ICD-10-CM | POA: Diagnosis not present

## 2019-07-18 DIAGNOSIS — F411 Generalized anxiety disorder: Secondary | ICD-10-CM

## 2019-07-18 DIAGNOSIS — Z8379 Family history of other diseases of the digestive system: Secondary | ICD-10-CM

## 2019-07-18 DIAGNOSIS — R197 Diarrhea, unspecified: Secondary | ICD-10-CM

## 2019-07-18 DIAGNOSIS — Z23 Encounter for immunization: Secondary | ICD-10-CM

## 2019-07-18 DIAGNOSIS — I10 Essential (primary) hypertension: Secondary | ICD-10-CM

## 2019-07-18 MED ORDER — AMLODIPINE BESYLATE 5 MG PO TABS: 5.0000 mg | ORAL_TABLET | Freq: Every day | ORAL | 3 refills | Status: DC

## 2019-07-18 MED ORDER — PAROXETINE HCL 10 MG PO TABS: 10.0000 mg | ORAL_TABLET | Freq: Every day | ORAL | 3 refills | Status: DC

## 2019-07-18 MED FILL — AMLODIPINE BESYLATE 5 MG TA: 5 | 90 days supply | Qty: 90 | Fill #0

## 2019-07-18 NOTE — Progress Notes (Signed)
HPI: Rachel Levy is a 38 y.o. female who  has no past medical history on file.  she presents to [facility] today, 07/18/19,  for chief complaint of:  F/u Depression/Anxiety  F/u HTN   Depression/anxiety: As of last visit 04/2019:  taking Paxil 10 mg and Alprazolam 0.25 mg prn. Reports this is working well for her. Alprazolam sparingly about 3 days per week.  Today 07/18/19:  Reports doing well on that medication  HTN: Started low dose Amlodipine last visit given risk factors. Pt monitoring BP at home.  Typically 110s to 120s over 90s BP Readings from Last 3 Encounters:  07/18/19 (!) 138/95  04/19/19 (!) 144/89  03/22/19 (!) 141/84   GI issues: Patient complains of intermittent GERD symptoms, nothing major.  She is concerned because her dad was recently diagnosed with celiac disorder.  Patient has alternating diarrhea/constipation.  She cannot really connect her symptoms to any particular food triggers, has not tried elimination diet.  Weight: Patient has been diligent about diet/exercise but is finding it difficult to lose weight.  Would like to discuss possible weight loss medications or any other suggestions.     At today's visit 07/18/19 ... PMH, PSH, FH reviewed and updated as needed.  Current medication list and allergy/intolerance hx reviewed and updated as needed. (See remainder of HPI, ROS, Phys Exam below)   No results found.  No results found for this or any previous visit (from the past 72 hour(s)).        ASSESSMENT/PLAN: The primary encounter diagnosis was Gastroesophageal reflux disease, unspecified whether esophagitis present. Diagnoses of Need for influenza vaccination, Hypertension goal BP (blood pressure) < 130/80, GAD (generalized anxiety disorder), Family history of celiac disease, and Diarrhea, unspecified type were also pertinent to this visit.  Advised would probably consider Contrave for long-term help for  weight loss.  Would steer clear of anything with stimulants given borderline blood pressure.  We will get lab evaluation for celiac disease, though patient was advised that colonoscopy is the gold standard for diagnosis for this.  Depending on how she is feeling with elimination diet, and how labs are looking, we may consider GI follow-up   Orders Placed This Encounter  Procedures  . Flu Vaccine QUAD 6+ mos PF IM (Fluarix Quad PF)  . Celiac Disease Panel     Meds ordered this encounter  Medications  . amLODipine (NORVASC) 5 MG tablet    Sig: Take 1 tablet (5 mg total) by mouth daily.    Dispense:  90 tablet    Refill:  3  . PARoxetine (PAXIL) 10 MG tablet    Sig: Take 1 tablet (10 mg total) by mouth daily.    Dispense:  90 tablet    Refill:  3    Patient Instructions  Things to remember for exercise for weight loss:   Please note - I am not a certified personal trainer. I can present you with ideas and general workout goals, but an exercise program is largely up to you. Find something you can stick with, and something you enjoy!   As you progress in your exercise regimen think about gradually increasing the following, week by week:   intensity (how strenuous is your workout)  frequency (how often you are exercising)  duration (how many minutes at a time you are exercising)  Walking for 20 minutes a day is certainly better than nothing, but more strenuous exercise will develop better cardiovascular fitness.  interval training (high-intensity alternating with low-intensity, think walk/jog rather than just walk)  muscle strengthening exercises (weight lifting, calisthenics, yoga) - this also helps prevent osteoporosis!   Things to remember for diet changes for weight loss:   Please note - I am not a certified dietician. I can present you with ideas and general diet goals, but a meal plan is largely up to you. I am happy to refer you to a dietician who can give you a detailed  meal plan.  Apps/logs are helpful to track how you're eating! It's not realistic to be logging everything you eat forever, but when you're starting a healthy eating lifestyle it's very helpful, and checking in with logs now and then helps you stick to your program!   Calorie restriction with the goal weight loss of no more than one to one and a half pounds per week.   Increase lean protein such as chicken, fish, [place].   Decrease fatty foods such as dairy, butter.   Decrease sugary foods. Avoid sugary drinks such as soda or juice.  Increase fiber found in fruit and vegetables.   Medications approved for long-term use for obesity  Contrave (Bupropion and Naltrexone)  Orlistat (Xenical, Alli)  Bupropion (Wellbutrin) I recommend that you research the above medications and see which one(s) your insurance may or may not cover: If you call your insurance, ask them specifically what medications are on their formulary that are approved for obesity treatment. They should be able to send you a list or tell you over the phone. Remember, medications aren't magic! You MUST be diligent about lifestyle changes as well!       Follow-up plan: Return for [facility] 04/2020 (call week prior to visit for labs), nurse visit next few weeks to verify BP machine .                                                 ################################################# ################################################# ################################################# #################################################    Current Meds  Medication Sig  . ALPRAZolam (XANAX) 0.25 MG tablet Take 1 tablet (0.25 mg total) by mouth 2 (two) times daily as needed for anxiety (panic).  Marland Kitchen amLODipine (NORVASC) 5 MG tablet Take 1 tablet (5 mg total) by mouth daily.  . norethindrone-ethinyl estradiol (LOESTRIN FE) 1-20 MG-MCG tablet Take 1 tablet by mouth daily.  Marland Kitchen PARoxetine  (PAXIL) 10 MG tablet Take 1 tablet (10 mg total) by mouth daily.  . [DISCONTINUED] amLODipine (NORVASC) 5 MG tablet Take 1 tablet (5 mg total) by mouth daily.  . [DISCONTINUED] PARoxetine (PAXIL) 10 MG tablet Take 1 tablet (10 mg total) by mouth daily.    Allergies  Allergen Reactions  . Penicillins Hives  . Doxycycline Rash  . Mircette [Desogestrel-Ethinyl Estradiol] Nausea And Vomiting  . Nuvaring [Etonogestrel-Ethinyl Estradiol] Other (See Comments)    Polyphagia, low libido, mood changes       Review of Systems:  Constitutional: No recent illness  HEENT: No  headache, no vision change  Cardiac: No  chest pain, No  pressure, No palpitations  Respiratory:  No  shortness of breath. No  Cough  Gastrointestinal: No  abdominal pain, +diarrhea/constipation per HPI   Musculoskeletal: No new myalgia/arthralgia  Skin: No  Rash  Neurologic: No  weakness, No  Dizziness  Psychiatric: No  concerns with depression, +concerns with anxiety  Exam:  BP Marland Kitchen)  138/95 (BP Location: Left Arm, Patient Position: Sitting, Cuff Size: Normal)   Pulse 84   Temp 98.2 F (36.8 C) (Oral)   Wt 188 lb 1.9 oz (85.3 kg)   BMI 31.30 kg/m   Constitutional: VS see above. General Appearance: alert, well-developed, well-nourished, NAD  Eyes: Normal lids and conjunctive, non-icteric sclera  Neck: No masses, trachea midline.   Respiratory: Normal respiratory effort. no wheeze, no rhonchi, no rales  Cardiovascular: S1/S2 normal, no murmur, no rub/gallop auscultated. RRR.   Musculoskeletal: Gait normal. Symmetric and independent movement of all extremities  Abdominal: non-tender, non-distended, no appreciable organomegaly, neg Murphy's, BS WNLx4  Neurological: Normal balance/coordination. No tremor.  Skin: warm, dry, intact.   Psychiatric: Normal judgment/insight. Normal mood and affect. Oriented x3.       Visit summary with medication list and pertinent instructions was printed for patient  to review, patient was advised to alert [place] if any updates are needed. All questions at time of visit were answered - patient instructed to contact office with any additional concerns. ER/RTC precautions were reviewed with the patient and understanding verbalized.   Note: Total time spent 25 minutes, greater than 50% of the visit was spent face-to-face counseling and coordinating care for the following: The primary encounter diagnosis was Gastroesophageal reflux disease, unspecified whether esophagitis present. Diagnoses of Need for influenza vaccination, Hypertension goal BP (blood pressure) < 130/80, GAD (generalized anxiety disorder), Family history of celiac disease, and Diarrhea, unspecified type were also pertinent to this visit.Marland Kitchen  Please note: voice recognition software was used to produce this document, and typos may escape review. Please contact Dr. Sheppard Coil for any needed clarifications.    Follow up plan: Return for [facility] 04/2020 (call week prior to visit for labs), nurse visit next few weeks to verify BP machine .

## 2019-07-18 NOTE — Patient Instructions (Signed)
Things to remember for exercise for weight loss:   Please note - I am not a certified personal trainer. I can present you with ideas and general workout goals, but an exercise program is largely up to you. Find something you can stick with, and something you enjoy!   As you progress in your exercise regimen think about gradually increasing the following, week by week:   intensity (how strenuous is your workout)  frequency (how often you are exercising)  duration (how many minutes at a time you are exercising)  Walking for 20 minutes a day is certainly better than nothing, but more strenuous exercise will develop better cardiovascular fitness.   interval training (high-intensity alternating with low-intensity, think walk/jog rather than just walk)  muscle strengthening exercises (weight lifting, calisthenics, yoga) - this also helps prevent osteoporosis!   Things to remember for diet changes for weight loss:   Please note - I am not a certified dietician. I can present you with ideas and general diet goals, but a meal plan is largely up to you. I am happy to refer you to a dietician who can give you a detailed meal plan.  Apps/logs are helpful to track how you're eating! It's not realistic to be logging everything you eat forever, but when you're starting a healthy eating lifestyle it's very helpful, and checking in with logs now and then helps you stick to your program!   Calorie restriction with the goal weight loss of no more than one to one and a half pounds per week.   Increase lean protein such as chicken, fish, [place].   Decrease fatty foods such as dairy, butter.   Decrease sugary foods. Avoid sugary drinks such as soda or juice.  Increase fiber found in fruit and vegetables.   Medications approved for long-term use for obesity  Contrave (Bupropion and Naltrexone)  Orlistat (Xenical, Alli)  Bupropion (Wellbutrin) I recommend that you research the above medications and  see which one(s) your insurance may or may not cover: If you call your insurance, ask them specifically what medications are on their formulary that are approved for obesity treatment. They should be able to send you a list or tell you over the phone. Remember, medications aren't magic! You MUST be diligent about lifestyle changes as well!

## 2019-07-19 MED ORDER — BUPROPION HCL ER (XL) 150 MG PO TB24: 150.0000 mg | ORAL_TABLET | ORAL | 0 refills | Status: DC

## 2019-07-19 MED FILL — buPROPion HCL ER (XL) 150 M: 150 | 90 days supply | Qty: 90 | Fill #0

## 2019-07-20 LAB — CELIAC DISEASE PANEL
(tTG) Ab, IgA: 1 U/mL
(tTG) Ab, IgG: 3 U/mL
Gliadin IgA: 7 Units
Gliadin IgG: 2 Units
Immunoglobulin A: 205 mg/dL (ref 47–310)

## 2019-08-06 MED FILL — BLISOVI FE 1/20 1-20 MG-MCG: 1-20 | 28 days supply | Qty: 28 | Fill #4

## 2019-08-08 ENCOUNTER — Ambulatory Visit: Payer: 59

## 2019-08-14 ENCOUNTER — Encounter: Payer: Self-pay | Admitting: Osteopathic Medicine

## 2019-08-14 MED FILL — PARoxetine HCL 10 MG TABS: 10 | 90 days supply | Qty: 90 | Fill #0

## 2019-08-15 MED FILL — ALPRAZolam 0.25 MG TABS: 0.25 | 10 days supply | Qty: 20 | Fill #2

## 2019-09-03 ENCOUNTER — Other Ambulatory Visit: Payer: Self-pay | Admitting: Osteopathic Medicine

## 2019-09-03 MED FILL — BLISOVI FE 1/20 1-20 MG-MCG: 1-20 | 28 days supply | Qty: 28 | Fill #5

## 2019-09-03 NOTE — Telephone Encounter (Signed)
Requested medication (s) are due for refill today: yes  Requested medication (s) are on the active medication list: yes  Last refill:  07/19/2019  Future visit scheduled: no  Notes to clinic:  review for refill   Requested Prescriptions  Pending Prescriptions Disp Refills   buPROPion (WELLBUTRIN XL) 150 MG 24 hr tablet [Pharmacy Med Name: buPROPion HCL ER (XL) 150 M 150 Tablet] 90 tablet 0    Sig: Take 1 tablet (150 mg total) by mouth every morning.      Psychiatry: Antidepressants - bupropion Failed - 09/03/2019  8:30 AM      Failed - Last BP in normal range    BP Readings from Last 1 Encounters:  07/18/19 (!) 138/95          Failed - Valid encounter within last 6 months    Recent Outpatient Visits           1 month ago Gastroesophageal reflux disease, unspecified whether esophagitis present   [facility], [place], DO   4 months ago Hypertension goal BP (blood pressure) < 130/80   [facility], [place], PA-C   5 months ago Osteochondral defect of talus   [facility], Gwen Her, MD   5 months ago GAD (generalized anxiety disorder)   [facility], [place] Areas, PA-C   6 months ago Osteochondral defect of talus   [facility], Gwen Her, MD

## 2019-09-06 ENCOUNTER — Other Ambulatory Visit: Payer: Self-pay | Admitting: Osteopathic Medicine

## 2019-09-25 DIAGNOSIS — Z20828 Contact with and (suspected) exposure to other viral communicable diseases: Secondary | ICD-10-CM | POA: Diagnosis not present

## 2019-10-05 ENCOUNTER — Other Ambulatory Visit: Payer: Self-pay | Admitting: Physician Assistant

## 2019-10-05 DIAGNOSIS — F411 Generalized anxiety disorder: Secondary | ICD-10-CM

## 2019-10-05 MED FILL — ALPRAZolam 0.25 MG TABS: 0.25 | 10 days supply | Qty: 20 | Fill #0

## 2019-10-05 MED FILL — BLISOVI FE 1/20 1-20 MG-MCG: 1-20 | 84 days supply | Qty: 84 | Fill #6

## 2019-10-05 MED FILL — AMLODIPINE BESYLATE 5 MG TA: 5 | 90 days supply | Qty: 90 | Fill #1

## 2019-10-05 NOTE — Telephone Encounter (Signed)
[facility] requesting med refill for alprazolam.

## 2019-10-17 ENCOUNTER — Ambulatory Visit (INDEPENDENT_AMBULATORY_CARE_PROVIDER_SITE_OTHER): Payer: 59 | Admitting: Sports Medicine

## 2019-10-17 ENCOUNTER — Other Ambulatory Visit: Payer: Self-pay

## 2019-10-17 ENCOUNTER — Ambulatory Visit (INDEPENDENT_AMBULATORY_CARE_PROVIDER_SITE_OTHER): Payer: 59

## 2019-10-17 ENCOUNTER — Encounter: Payer: Self-pay | Admitting: Sports Medicine

## 2019-10-17 DIAGNOSIS — M958 Other specified acquired deformities of musculoskeletal system: Secondary | ICD-10-CM

## 2019-10-17 DIAGNOSIS — M25571 Pain in right ankle and joints of right foot: Secondary | ICD-10-CM | POA: Diagnosis not present

## 2019-10-17 NOTE — Progress Notes (Signed)
    Procedures performed today:    Procedure: Real-time Ultrasound Guided injection of the right ankle joint Device: Samsung HS60  Verbal informed consent obtained.  Time-out conducted.  Noted no overlying erythema, induration, or other signs of local infection.  Skin prepped in a sterile fashion.  Local anesthesia: Topical Ethyl chloride.  With sterile technique and under real time ultrasound guidance: 1 cc Kenalog 40, 1 cc lidocaine, 1 cc bupivacaine injected easily Completed without difficulty  Pain immediately resolved suggesting accurate placement of the medication.  Advised to call if fevers/chills, erythema, induration, drainage, or persistent bleeding.  Images permanently stored and available for review in the ultrasound unit.  Impression: Technically successful ultrasound guided injection.  Independent interpretation of tests performed by another provider:   None.  Impression and Recommendations:    Osteochondral defect of talus Rachel Levy is a pleasant 39 year old female, she returns for recurrence of pain in her right ankle. Incidentally noted after an injury sometime last summer was an OCD of the medial talar dome. At that time we injected her ankle joint and she returned pain-free. She is now having recurrence of pain, that has not responded to 6 weeks of conservative measures. Repeat ankle joint injection today, she can do a Doximity visit in 1 month to make sure things are good. Due to the recurrence of pain in spite of conservative measures we are also going to proceed with x-rays and an MRI.    ___________________________________________ Ihor Austin. Benjamin Stain, M.D., ABFM., CAQSM. Primary Care and Sports Medicine [facility]  Adjunct Instructor of Family Medicine  [employer]

## 2019-10-17 NOTE — Assessment & Plan Note (Signed)
Rachel Levy is a pleasant 39 year old female, she returns for recurrence of pain in her right ankle. Incidentally noted after an injury sometime last summer was an OCD of the medial talar dome. At that time we injected her ankle joint and she returned pain-free. She is now having recurrence of pain, that has not responded to 6 weeks of conservative measures. Repeat ankle joint injection today, she can do a Doximity visit in 1 month to make sure things are good. Due to the recurrence of pain in spite of conservative measures we are also going to proceed with x-rays and an MRI.

## 2019-10-21 ENCOUNTER — Other Ambulatory Visit: Payer: Self-pay

## 2019-10-21 ENCOUNTER — Ambulatory Visit (INDEPENDENT_AMBULATORY_CARE_PROVIDER_SITE_OTHER): Payer: 59

## 2019-10-21 DIAGNOSIS — M958 Other specified acquired deformities of musculoskeletal system: Secondary | ICD-10-CM | POA: Diagnosis not present

## 2019-10-21 DIAGNOSIS — R600 Localized edema: Secondary | ICD-10-CM | POA: Diagnosis not present

## 2019-11-12 MED FILL — PARoxetine HCL 10 MG TABS: 10 | 90 days supply | Qty: 90 | Fill #1

## 2019-11-14 ENCOUNTER — Telehealth (INDEPENDENT_AMBULATORY_CARE_PROVIDER_SITE_OTHER): Payer: 59 | Admitting: Sports Medicine

## 2019-11-14 DIAGNOSIS — M958 Other specified acquired deformities of musculoskeletal system: Secondary | ICD-10-CM | POA: Diagnosis not present

## 2019-11-14 NOTE — Assessment & Plan Note (Signed)
Rachel Levy returns, she is a pleasant 39 year old female with a talar dome osteochondral defect, medial aspect. At the last visit I injected her ankle joint and today she is pain-free, she can do what she wants to do, only has a slight burning but does not have any interest in surgery. I informed her that she would likely have some osteoarthritis in the ankle down the road, but for now she can continue with supportive shoes, home rehab and return to see me as needed. We did repeat her MRI that showed a similar appearance to her OCD with surrounding T2 edema.

## 2019-11-14 NOTE — Progress Notes (Signed)
   Virtual Visit via WebEx/MyChart   I connected with  Rachel Levy  on 11/14/19 via WebEx/MyChart/Doximity Video and verified that I am speaking with the correct person using two identifiers.   I discussed the limitations, risks, security and privacy concerns of performing an evaluation and management service by WebEx/MyChart/Doximity Video, including the higher likelihood of inaccurate diagnosis and treatment, and the availability of in person appointments.  We also discussed the likely need of an additional face to face encounter for complete and high quality delivery of care.  I also discussed with the patient that there may be a patient responsible charge related to this service. The patient expressed understanding and wishes to proceed.  Provider location is either at home or medical facility. Patient location is at their home, different from provider location. People involved in care of the patient during this telehealth encounter were myself, my nurse/medical assistant, and my front office/scheduling team member.  Review of Systems: No fevers, chills, night sweats, weight loss, chest pain, or shortness of breath.   Objective Findings:    General: Speaking full sentences, no audible heavy breathing.  Sounds alert and appropriately interactive.  Appears well.  Face symmetric.  Extraocular movements intact.  Pupils equal and round.  No nasal flaring or accessory muscle use visualized.  Independent interpretation of tests performed by another provider:   None.  Impression and Recommendations:    Osteochondral defect of talus Rachel Levy returns, she is a pleasant 39 year old female with a talar dome osteochondral defect, medial aspect. At the last visit I injected her ankle joint and today she is pain-free, she can do what she wants to do, only has a slight burning but does not have any interest in surgery. I informed her that she would likely have some osteoarthritis in the ankle down the  road, but for now she can continue with supportive shoes, home rehab and return to see me as needed. We did repeat her MRI that showed a similar appearance to her OCD with surrounding T2 edema.   I discussed the above assessment and treatment plan with the patient. The patient was provided an opportunity to ask questions and all were answered. The patient agreed with the plan and demonstrated an understanding of the instructions.   The patient was advised to call back or seek an in-person evaluation if the symptoms worsen or if the condition fails to improve as anticipated.   I provided 30 minutes of face to face and non-face-to-face time during this encounter date, time was needed to gather information, review chart, records, communicate/coordinate with staff remotely, as well as complete documentation.   ___________________________________________ Ihor Austin. Benjamin Stain, M.D., ABFM., CAQSM. Primary Care and Sports Medicine [facility]  Adjunct Instructor of Family Medicine  [employer]

## 2019-11-19 ENCOUNTER — Other Ambulatory Visit: Payer: Self-pay

## 2019-11-19 ENCOUNTER — Ambulatory Visit (INDEPENDENT_AMBULATORY_CARE_PROVIDER_SITE_OTHER): Payer: 59 | Admitting: Osteopathic Medicine

## 2019-11-19 ENCOUNTER — Encounter: Payer: Self-pay | Admitting: Osteopathic Medicine

## 2019-11-19 ENCOUNTER — Ambulatory Visit (INDEPENDENT_AMBULATORY_CARE_PROVIDER_SITE_OTHER): Payer: 59

## 2019-11-19 VITALS — BP 147/100 | HR 76 | Temp 98.2°F | Wt 189.1 lb

## 2019-11-19 DIAGNOSIS — K828 Other specified diseases of gallbladder: Secondary | ICD-10-CM | POA: Diagnosis not present

## 2019-11-19 DIAGNOSIS — K760 Fatty (change of) liver, not elsewhere classified: Secondary | ICD-10-CM | POA: Diagnosis not present

## 2019-11-19 DIAGNOSIS — R1011 Right upper quadrant pain: Secondary | ICD-10-CM

## 2019-11-19 DIAGNOSIS — K819 Cholecystitis, unspecified: Secondary | ICD-10-CM

## 2019-11-19 NOTE — Progress Notes (Addendum)
Rachel Levy is a 39 y.o. female who presents to  [facility] Primary Care & Sports Medicine at [facility]  today, 11/19/19, seeking care for the following:  Pain in ribcage and back    ASSESSMENT & PLAN with other pertinent history/findings:  The encounter diagnosis was RUQ pain.   CC/HPI: Pain in ribcage and back, described as cremaping coming and going, points to RUQ just below ribs and reports radiation  Of discomfort into R shoulder, x5 days, assoc w/ low appetite, diarrhea x1 episode two days ago, no OTC meds or other home therapies tried for relief.   Exam: (+)RUQ tenderness (+)Murphy's   Plan: US liver/GB, avoid fatty foods, mild symptoms I'm not too concerned about getting labs (don't suspect cholecystitis) but ER precautions reviewed, may consider further w/u or GI referral depending on [place]    Patient Instructions  I suspect something going on with gallbladder Will get ultrasound today Consider labs / further workup depending on ultrasound results I should have results back by tomorrow - will keep you posted  If severe pain, fever, nausea/vomiting or other concerns - please seek emergency medical care!       Orders Placed This Encounter  Procedures  . US ABDOMEN LIMITED RUQ    No orders of the defined types were placed in this encounter.      Follow-up instructions: Return for RECHECK PENDING ULTRASOUND RESULTS / IF WORSE OR CHANGE.                                         BP (!) 147/100 (BP Location: Left Arm, Patient Position: Sitting, Cuff Size: Normal)   Pulse 76   Temp 98.2 F (36.8 C) (Oral)   Wt 189 lb 1.3 oz (85.8 kg)   BMI 31.46 kg/m   Current Meds  Medication Sig  . ALPRAZolam (XANAX) 0.25 MG tablet TAKE 1 TABLET (0.25 MG TOTAL) BY MOUTH 2 (TWO) TIMES DAILY AS NEEDED FOR ANXIETY (PANIC).  Marland Kitchen amLODipine (NORVASC) 5 MG tablet Take 1 tablet (5 mg total) by mouth daily.  Marland Kitchen buPROPion  (WELLBUTRIN XL) 150 MG 24 hr tablet TAKE 1 TABLET (150 MG TOTAL) BY MOUTH EVERY MORNING.  Marland Kitchen norethindrone-ethinyl estradiol (LOESTRIN FE) 1-20 MG-MCG tablet Take 1 tablet by mouth daily.  Marland Kitchen PARoxetine (PAXIL) 10 MG tablet Take 1 tablet (10 mg total) by mouth daily.    No results found for this or any previous visit (from the past 72 hour(s)).  No results found.    All questions at time of visit were answered - patient instructed to contact office with any additional concerns or updates.  ER/RTC precautions were reviewed with the patient.  Please note: voice recognition software was used to produce this document, and typos may escape review. Please contact Dr. Lyn Hollingshead for any needed clarifications.

## 2019-11-19 NOTE — Patient Instructions (Signed)
I suspect something going on with gallbladder Will get ultrasound today Consider labs / further workup depending on ultrasound results I should have results back by tomorrow - will keep you posted  If severe pain, fever, nausea/vomiting or other concerns - please seek emergency medical care!

## 2019-11-23 ENCOUNTER — Encounter: Payer: Self-pay | Admitting: Osteopathic Medicine

## 2019-11-28 ENCOUNTER — Encounter: Payer: Self-pay | Admitting: Osteopathic Medicine

## 2019-11-30 DIAGNOSIS — R630 Anorexia: Secondary | ICD-10-CM | POA: Diagnosis not present

## 2019-11-30 DIAGNOSIS — R1011 Right upper quadrant pain: Secondary | ICD-10-CM | POA: Diagnosis not present

## 2019-11-30 DIAGNOSIS — K8050 Calculus of bile duct without cholangitis or cholecystitis without obstruction: Secondary | ICD-10-CM | POA: Diagnosis not present

## 2019-11-30 DIAGNOSIS — Z3202 Encounter for pregnancy test, result negative: Secondary | ICD-10-CM | POA: Diagnosis not present

## 2019-12-13 DIAGNOSIS — F411 Generalized anxiety disorder: Secondary | ICD-10-CM | POA: Diagnosis not present

## 2019-12-13 DIAGNOSIS — I10 Essential (primary) hypertension: Secondary | ICD-10-CM | POA: Diagnosis not present

## 2019-12-13 DIAGNOSIS — K8020 Calculus of gallbladder without cholecystitis without obstruction: Secondary | ICD-10-CM | POA: Insufficient documentation

## 2019-12-13 MED FILL — HYDROCODON-APAP 5-325: 5-325 | 4 days supply | Qty: 25 | Fill #0

## 2019-12-13 MED FILL — PROMETHAZINE 25 MG TABLET: 25 | 15 days supply | Qty: 60 | Fill #0

## 2019-12-14 MED FILL — ALPRAZolam 0.25 MG TABS: 0.25 | 10 days supply | Qty: 20 | Fill #1

## 2019-12-26 MED FILL — BLISOVI FE 1/20 1-20 MG-MCG: 1-20 | 84 days supply | Qty: 84 | Fill #7

## 2019-12-26 MED FILL — AMLODIPINE BESYLATE 5 MG TA: 5 | 90 days supply | Qty: 90 | Fill #2

## 2020-01-03 DIAGNOSIS — Z01812 Encounter for preprocedural laboratory examination: Secondary | ICD-10-CM | POA: Diagnosis not present

## 2020-01-03 DIAGNOSIS — Z20822 Contact with and (suspected) exposure to covid-19: Secondary | ICD-10-CM | POA: Diagnosis not present

## 2020-01-03 DIAGNOSIS — K8020 Calculus of gallbladder without cholecystitis without obstruction: Secondary | ICD-10-CM | POA: Diagnosis not present

## 2020-01-08 DIAGNOSIS — K8010 Calculus of gallbladder with chronic cholecystitis without obstruction: Secondary | ICD-10-CM | POA: Diagnosis not present

## 2020-01-08 DIAGNOSIS — K8020 Calculus of gallbladder without cholecystitis without obstruction: Secondary | ICD-10-CM | POA: Diagnosis not present

## 2020-01-08 DIAGNOSIS — K824 Cholesterolosis of gallbladder: Secondary | ICD-10-CM | POA: Diagnosis not present

## 2020-01-08 DIAGNOSIS — I10 Essential (primary) hypertension: Secondary | ICD-10-CM | POA: Diagnosis not present

## 2020-01-08 DIAGNOSIS — K811 Chronic cholecystitis: Secondary | ICD-10-CM | POA: Diagnosis not present

## 2020-02-07 MED FILL — ALPRAZolam 0.25 MG TABS: 0.25 | 10 days supply | Qty: 20 | Fill #2

## 2020-02-07 MED FILL — PARoxetine HCL 10 MG TABS: 10 | 90 days supply | Qty: 90 | Fill #2

## 2020-03-20 ENCOUNTER — Other Ambulatory Visit: Payer: Self-pay | Admitting: Physician Assistant

## 2020-03-20 ENCOUNTER — Other Ambulatory Visit: Payer: Self-pay | Admitting: Medical-Surgical

## 2020-03-20 DIAGNOSIS — Z30011 Encounter for initial prescription of contraceptive pills: Secondary | ICD-10-CM

## 2020-03-20 MED FILL — BLISOVI FE 1/20 1-20 MG-MCG: 1-20 | 84 days supply | Qty: 84 | Fill #0

## 2020-04-10 ENCOUNTER — Ambulatory Visit (INDEPENDENT_AMBULATORY_CARE_PROVIDER_SITE_OTHER): Payer: 59 | Admitting: Medical-Surgical

## 2020-04-10 ENCOUNTER — Other Ambulatory Visit: Payer: Self-pay

## 2020-04-10 ENCOUNTER — Ambulatory Visit (INDEPENDENT_AMBULATORY_CARE_PROVIDER_SITE_OTHER): Payer: 59

## 2020-04-10 ENCOUNTER — Encounter: Payer: Self-pay | Admitting: Medical-Surgical

## 2020-04-10 VITALS — BP 172/91 | HR 84 | Temp 98.4°F | Ht 65.0 in | Wt 196.2 lb

## 2020-04-10 DIAGNOSIS — M79672 Pain in left foot: Secondary | ICD-10-CM

## 2020-04-10 DIAGNOSIS — I10 Essential (primary) hypertension: Secondary | ICD-10-CM

## 2020-04-10 DIAGNOSIS — M898X7 Other specified disorders of bone, ankle and foot: Secondary | ICD-10-CM | POA: Diagnosis not present

## 2020-04-10 DIAGNOSIS — M25572 Pain in left ankle and joints of left foot: Secondary | ICD-10-CM

## 2020-04-10 MED ORDER — PREDNISONE 50 MG PO TABS: 50.0000 mg | ORAL_TABLET | Freq: Every day | ORAL | 0 refills | Status: DC

## 2020-04-10 MED FILL — AMLODIPINE BESYLATE 5 MG TA: 5 | 90 days supply | Qty: 90 | Fill #3

## 2020-04-10 MED FILL — predniSONE 50 MG TABS: 50 | 5 days supply | Qty: 5 | Fill #0

## 2020-04-10 NOTE — Progress Notes (Signed)
Subjective:    CC: Left foot and ankle pain  HPI: Pleasant 39 year old female presenting today for evaluation of her left foot and ankle after awakening with pain 7 days ago.  Reports that she has osteochondral defects that affect both ankles but her right foot and ankle is usually the one affected.  She does not remember having any falls, injuries, or twisting of the foot.  Pain is described as a burning with numbness and tingling.  Notes that the left ankle is popping a lot and that it also hurts to move her toes.  She did take 1 dose of ibuprofen today but does not usually because of her elevated blood pressure.  Reports that she usually gets an injection in the right foot when it flares like this.  Has tried prednisone in the past with minimal relief.  Blood pressure elevated in office today at 172/91.  Reports that her blood pressure is always high in this range despite the medication she is taking.  Has not followed up with her PCP in a while for hypertension and is aware that she should probably come back in to discuss this.  Denies chest pain, shortness of breath, lower extremity edema, palpitations, dizziness, and headaches.  I reviewed the past medical history, family history, social history, surgical history, and allergies today and no changes were needed.  Please see the problem list section below in epic for further details.  Past Medical History: History reviewed. No pertinent past medical history. Past Surgical History: Past Surgical History:  Procedure Laterality Date  . no history of surgeries     Social History: Social History   Socioeconomic History  . Marital status: Married    Spouse name: Not on file  . Number of children: Not on file  . Years of education: Not on file  . Highest education level: Not on file  Occupational History  . Occupation: receptionist  Tobacco Use  . Smoking status: Former Smoker    Packs/day: 0.50    Years: 12.00    Pack years: 6.00     Types: Cigarettes    Quit date: 02/12/2012    Years since quitting: 8.1  . Smokeless tobacco: Never Used  Vaping Use  . Vaping Use: Never used  Substance and Sexual Activity  . Alcohol use: Yes    Alcohol/week: 3.0 standard drinks    Types: 3 Standard drinks or equivalent per week  . Drug use: Never  . Sexual activity: Yes    Partners: Male    Birth control/protection: Inserts  Other Topics Concern  . Not on file  Social History Narrative  . Not on file   Social Determinants of Health   Financial Resource Strain:   . Difficulty of Paying Living Expenses:   Food Insecurity:   . Worried About Programme researcher, broadcasting/film/video in the Last Year:   . Barista in the Last Year:   Transportation Needs:   . Freight forwarder (Medical):   Marland Kitchen Lack of Transportation (Non-Medical):   Physical Activity:   . Days of Exercise per Week:   . Minutes of Exercise per Session:   Stress:   . Feeling of Stress :   Social Connections:   . Frequency of Communication with Friends and Family:   . Frequency of Social Gatherings with Friends and Family:   . Attends Religious Services:   . Active Member of Clubs or Organizations:   . Attends Banker Meetings:   .  Marital Status:    Family History: Family History  Problem Relation Age of Onset  . Diabetes Father   . Hyperlipidemia Mother   . Healthy Sister   . Hyperlipidemia Maternal Aunt   . Hyperlipidemia Maternal Uncle   . Diabetes Maternal Grandfather    Allergies: Allergies  Allergen Reactions  . Penicillins Hives  . Doxycycline Rash  . Mircette [Desogestrel-Ethinyl Estradiol] Nausea And Vomiting  . Nuvaring [Etonogestrel-Ethinyl Estradiol] Other (See Comments)    Polyphagia, low libido, mood changes   Medications: See med rec.  Review of Systems: See HPI for pertinent positives and negatives.   Objective:    General: Well Developed, well nourished, and in no acute distress.  Neuro: Alert and oriented x3.  HEENT:  Normocephalic, atraumatic.  Skin: Warm and dry. Cardiac: Regular rate and rhythm, no murmurs rubs or gallops, no lower extremity edema.  Respiratory: Clear to auscultation bilaterally. Not using accessory muscles, speaking in full sentences.  Left foot/ankle: Tenderness to palpation along the lateral Achilles tendon and lateral malleolus.  Pain with extension and flexion of the ankle as well as extension and flexion of all toes.  Pronation and supination of ankle produce no pain while pronation/supination of the forefoot was extremely painful.  Sensation intact to 10 points on the sole of the left foot.  Pulses positive.  Skin warm and dry.  No edema or erythema.  Impression and Recommendations:    1. Left foot/ankle pain We will go ahead and get x-rays of the left foot and ankle today.  Patient willing to try a burst dose of prednisone so sending in 50 mg daily x5 days.  Recommend wearing a brace to allow for compression.  When able to rest, please elevate foot and consider icing times a day.  May benefit from oral anti-inflammatory once she is done with prednisone. - DG Foot Complete Left; Future - DG Ankle Complete Left; Future  2.  Essential hypertension As she is currently in pain, no changes made to medications today.  Advised patient to return for follow-up with PCP regarding her blood pressure at her earliest convenience.  May benefit from bringing home cuff in for verification of accuracy against our machines.  Return for HTN follow up at your earliest convenience. ___________________________________________ Thayer Ohm, DNP, APRN, FNP-BC Primary Care and Sports Medicine [facility]

## 2020-04-11 ENCOUNTER — Ambulatory Visit: Payer: 59 | Admitting: Nurse Practitioner

## 2020-04-11 ENCOUNTER — Encounter: Payer: Self-pay | Admitting: Medical-Surgical

## 2020-04-11 MED ORDER — MELOXICAM 15 MG PO TABS: ORAL_TABLET | ORAL | 1 refills | Status: DC

## 2020-04-11 MED FILL — MELOXICAM 15 MG TABLET: 15 | 30 days supply | Qty: 30 | Fill #0

## 2020-04-23 ENCOUNTER — Other Ambulatory Visit: Payer: Self-pay

## 2020-04-23 ENCOUNTER — Ambulatory Visit (INDEPENDENT_AMBULATORY_CARE_PROVIDER_SITE_OTHER): Payer: 59 | Admitting: Sports Medicine

## 2020-04-23 ENCOUNTER — Encounter: Payer: Self-pay | Admitting: Sports Medicine

## 2020-04-23 DIAGNOSIS — M958 Other specified acquired deformities of musculoskeletal system: Secondary | ICD-10-CM

## 2020-04-23 MED ORDER — TRAMADOL HCL 50 MG PO TABS: 50.0000 mg | ORAL_TABLET | Freq: Three times a day (TID) | ORAL | 0 refills | Status: DC | PRN

## 2020-04-23 MED FILL — traMADol HCL 50 MG TABS: 50 | 4 days supply | Qty: 21 | Fill #0

## 2020-04-23 NOTE — Assessment & Plan Note (Addendum)
Rachel Levy is a pleasant 39 year old female, she has bilateral talar dome osteochondral defects, historically she has been well managed with an ankle brace, activity modification, and an injection, last done into her right ankle.  She developed pain in her left ankle, she was seen by one of my colleagues and prescribed prednisone. She also has some meloxicam, her left ankle is doing a little bit better but the right ankle is severely painful. She is interested in an injection but she typically has some discomfort for the next couple of days, she plans to schedule an injection before the weekend. In the meantime I would like her in a lace up ASO, and I would like a second opinion from podiatry. Ultimately she also may benefit from custom orthotics with lateral wedging. I am also going to add some tramadol for pain relief in the meantime.

## 2020-04-23 NOTE — Progress Notes (Signed)
    Procedures performed today:    None.  Independent interpretation of notes and tests performed by another provider:   None.  Brief History, Exam, Impression, and Recommendations:    Osteochondral defect of talus Rachel Levy is a pleasant 39 year old female, she has bilateral talar dome osteochondral defects, historically she has been well managed with an ankle brace, activity modification, and an injection, last done into her right ankle.  She developed pain in her left ankle, she was seen by one of my colleagues and prescribed prednisone. She also has some meloxicam, her left ankle is doing a little bit better but the right ankle is severely painful. She is interested in an injection but she typically has some discomfort for the next couple of days, she plans to schedule an injection before the weekend. In the meantime I would like her in a lace up ASO, and I would like a second opinion from podiatry. Ultimately she also may benefit from custom orthotics with lateral wedging. I am also going to add some tramadol for pain relief in the meantime.    ___________________________________________ Ihor Austin. Benjamin Stain, M.D., ABFM., CAQSM. Primary Care and Sports Medicine [facility]  Adjunct Instructor of Family Medicine  [employer]

## 2020-04-30 ENCOUNTER — Other Ambulatory Visit: Payer: Self-pay

## 2020-04-30 ENCOUNTER — Encounter: Payer: Self-pay | Admitting: Obstetrics and Gynecology

## 2020-04-30 ENCOUNTER — Ambulatory Visit (INDEPENDENT_AMBULATORY_CARE_PROVIDER_SITE_OTHER): Payer: 59 | Admitting: Obstetrics and Gynecology

## 2020-04-30 VITALS — BP 137/86 | HR 83 | Ht 65.0 in | Wt 197.0 lb

## 2020-04-30 DIAGNOSIS — Z01419 Encounter for gynecological examination (general) (routine) without abnormal findings: Secondary | ICD-10-CM | POA: Diagnosis not present

## 2020-04-30 NOTE — Progress Notes (Signed)
GYNECOLOGY ANNUAL PREVENTATIVE CARE ENCOUNTER NOTE  History:     Rachel Levy is a 39 y.o. G21P1011 female here for a routine annual gynecologic exam.  Current complaints: changes in her mood. Feel irritable more often. Feels hormonal more often.   Denies abnormal vaginal bleeding, discharge, pelvic pain, problems with intercourse or other gynecologic concerns.  Married.     Gynecologic History Patient's last menstrual period was 04/09/2020. Contraception: OCP (estrogen/progesterone) Last Pap: 2019. Results were: normal with negative HPV Last mammogram: NA.  Obstetric History OB History  Gravida Para Term Preterm AB Living  2 1 1   1 1   SAB TAB Ectopic Multiple Live Births               # Outcome Date GA Lbr Len/2nd Weight Sex Delivery Anes PTL Lv  2 Term 04/27/14     Vag-Spont     1 AB             Past Medical History:  Diagnosis Date  . Anxiety     Past Surgical History:  Procedure Laterality Date  . no history of surgeries      Current Outpatient Medications on File Prior to Visit  Medication Sig Dispense Refill  . ALPRAZolam (XANAX) 0.25 MG tablet TAKE 1 TABLET (0.25 MG TOTAL) BY MOUTH 2 (TWO) TIMES DAILY AS NEEDED FOR ANXIETY (PANIC). 20 tablet 2  . amLODipine (NORVASC) 5 MG tablet Take 1 tablet (5 mg total) by mouth daily. 90 tablet 3  . BLISOVI FE 1/20 1-20 MG-MCG tablet TAKE 1 TABLET BY MOUTH DAILY. 84 tablet 3  . meloxicam (MOBIC) 15 MG tablet Take 1 tablet daily for 14 days then take 1 tablet daily as needed. 30 tablet 1  . PARoxetine (PAXIL) 10 MG tablet Take 1 tablet (10 mg total) by mouth daily. 90 tablet 3  . traMADol (ULTRAM) 50 MG tablet Take 1-2 tablets (50-100 mg total) by mouth every 8 (eight) hours as needed for moderate pain. Maximum 6 tabs per day. 21 tablet 0   No current facility-administered medications on file prior to visit.    Allergies  Allergen Reactions  . Penicillins Hives  . Doxycycline Rash  . Mircette [Desogestrel-Ethinyl  Estradiol] Nausea And Vomiting  . Nuvaring [Etonogestrel-Ethinyl Estradiol] Other (See Comments)    Polyphagia, low libido, mood changes    Social History:  reports that she quit smoking about 8 years ago. Her smoking use included cigarettes. She has a 6.00 pack-year smoking history. She has never used smokeless tobacco. She reports current alcohol use of about 3.0 standard drinks of alcohol per week. She reports that she does not use drugs.  Family History  Problem Relation Age of Onset  . Diabetes Father   . Hyperlipidemia Mother   . Healthy Sister   . Hyperlipidemia Maternal Aunt   . Hyperlipidemia Maternal Uncle   . Diabetes Maternal Grandfather     The following portions of the patient's history were reviewed and updated as appropriate: allergies, current medications, past family history, past medical history, past social history, past surgical history and problem list.  Review of Systems Pertinent items noted in HPI and remainder of comprehensive ROS otherwise negative.  Physical Exam:  BP 137/86   Pulse 83   Ht 5\' 5"  (1.651 m)   Wt 197 lb (89.4 kg)   LMP 04/09/2020   HC 16" (40.6 cm)   BMI 32.78 kg/m  CONSTITUTIONAL: Well-developed, well-nourished female in no acute distress.  HENT:  Normocephalic, atraumatic, External right and left ear normal. Oropharynx is clear and moist EYES: Conjunctivae and EOM are normal. Pupils are equal, round, and reactive to light. No scleral icterus.  NECK: Normal range of motion, supple, no masses.  Normal thyroid.  SKIN: Skin is warm and dry. No rash noted. Not diaphoretic. No erythema. No pallor. MUSCULOSKELETAL: Normal range of motion. No tenderness.  No cyanosis, clubbing, or edema.  2+ distal pulses. NEUROLOGIC: Alert and oriented to person, place, and time. Normal reflexes, muscle tone coordination.  PSYCHIATRIC: Normal mood and affect. Normal behavior. Normal judgment and thought content. CARDIOVASCULAR: Normal heart rate noted,  regular rhythm RESPIRATORY: Clear to auscultation bilaterally. Effort and breath sounds normal, no problems with respiration noted. BREASTS: Symmetric in size. No masses, tenderness, skin changes, nipple drainage, or lymphadenopathy bilaterally. Performed in the presence of a chaperone. ABDOMEN: Soft, no distention noted.  No tenderness, rebound or guarding.  PELVIC: Normal appearing external genitalia and urethral meatus. Normal uterine size, no other palpable masses, no uterine or adnexal tenderness.  Performed in the presence of a chaperone. Pap Deferred    Assessment and Plan:   1. Women's annual routine gynecological examination  - Vitamin D (25 hydroxy) - TSH - Add Vitamin B6 and Magnesium.   Will follow up results of pap smear and manage accordingly. Routine preventative health maintenance measures emphasized. Please refer to After Visit Summary for other counseling recommendations.       Cambrey Lupi, Harolyn Rutherford, NP [facility] for [employer], [facility]

## 2020-05-01 LAB — VITAMIN D 25 HYDROXY (VIT D DEFICIENCY, FRACTURES): Vit D, 25-Hydroxy: 13 ng/mL — ABNORMAL LOW (ref 30–100)

## 2020-05-01 LAB — TSH: TSH: 1.35 mIU/L

## 2020-05-07 ENCOUNTER — Other Ambulatory Visit: Payer: Self-pay

## 2020-05-07 DIAGNOSIS — M958 Other specified acquired deformities of musculoskeletal system: Secondary | ICD-10-CM

## 2020-05-10 MED FILL — PARoxetine HCL 10 MG TABS: 10 | 90 days supply | Qty: 90 | Fill #0

## 2020-05-13 MED FILL — PARoxetine HCL 10 MG TABS: 10 | 90 days supply | Qty: 90 | Fill #0 | Status: TO

## 2020-05-13 MED FILL — PARoxetine HCL 10 MG TABS: 10 | 90 days supply | Qty: 90 | Fill #0

## 2020-05-20 ENCOUNTER — Encounter: Payer: Self-pay | Admitting: Family Medicine

## 2020-05-20 ENCOUNTER — Telehealth (INDEPENDENT_AMBULATORY_CARE_PROVIDER_SITE_OTHER): Payer: 59 | Admitting: Family Medicine

## 2020-05-20 DIAGNOSIS — J0100 Acute maxillary sinusitis, unspecified: Secondary | ICD-10-CM | POA: Diagnosis not present

## 2020-05-20 MED ORDER — CLINDAMYCIN HCL 300 MG PO CAPS: 300.0000 mg | ORAL_CAPSULE | Freq: Four times a day (QID) | ORAL | 0 refills | Status: AC

## 2020-05-20 MED FILL — CLINDAMYCIN HCL 300 MG CAPS: 300 | 7 days supply | Qty: 28 | Fill #0

## 2020-05-20 NOTE — Assessment & Plan Note (Signed)
Start clindamycin 300mg  qid as she has listed allergy to penicillin and doxycycline.  She is unsure if she has taken cephalosporins safely previously.  Continue supportive care with good hydration and rest.   Instructed to contact clinic if symptoms persist or worsen.

## 2020-05-20 NOTE — Progress Notes (Signed)
Symptoms: Feels like a sinus infection. Right nostril feels swollen. Right side of face is throbbing.  Started yesterday, 9/13  Not taking any medications for symptoms.

## 2020-05-20 NOTE — Progress Notes (Signed)
Rachel Levy - 38 y.o. female MRN [id number]  Date of birth: 1981/04/24   This visit type was conducted due to national recommendations for restrictions regarding the COVID-19 Pandemic (e.g. social distancing).  This format is felt to be most appropriate for this patient at this time.  All issues noted in this document were discussed and addressed.  No physical exam was performed (except for noted visual exam findings with Video Visits).  I discussed the limitations of evaluation and management by telemedicine and the availability of in person appointments. The patient expressed understanding and agreed to proceed.  I connected with@ on 05/20/20 at  2:20 PM EDT by a video enabled telemedicine application and verified that I am speaking with the correct person using two identifiers.  Present at visit: Everrett Coombe, DO [name]   Patient Location: Home [address] [place] [address]   Provider location:   [facility]  Chief Complaint  Patient presents with  . Facial Swelling  . Nasal Congestion    HPI  Rachel Levy is a 39 y.o. female who presents via audio/video conferencing for a telehealth visit today.  She has complaint of R sided facial pain/pressure, mild headache, thick yellow nasal discharge.  This feels like previous sinus infections.  She denies fever, chills, body aches, cough or shortness of breath.     ROS:  A comprehensive ROS was completed and negative except as noted per HPI  Past Medical History:  Diagnosis Date  . Anxiety     Past Surgical History:  Procedure Laterality Date  . no history of surgeries      Family History  Problem Relation Age of Onset  . Diabetes Father   . Hyperlipidemia Mother   . Healthy Sister   . Hyperlipidemia Maternal Aunt   . Hyperlipidemia Maternal Uncle   . Diabetes Maternal Grandfather     Social History   Socioeconomic History  . Marital status: Married    Spouse name: Not on file  . Number of children:  Not on file  . Years of education: Not on file  . Highest education level: Not on file  Occupational History  . Occupation: receptionist  Tobacco Use  . Smoking status: Former Smoker    Packs/day: 0.50    Years: 12.00    Pack years: 6.00    Types: Cigarettes    Quit date: 02/12/2012    Years since quitting: 8.2  . Smokeless tobacco: Never Used  Vaping Use  . Vaping Use: Never used  Substance and Sexual Activity  . Alcohol use: Yes    Alcohol/week: 3.0 standard drinks    Types: 3 Standard drinks or equivalent per week  . Drug use: Never  . Sexual activity: Yes    Partners: Male    Birth control/protection: Pill  Other Topics Concern  . Not on file  Social History Narrative  . Not on file   Social Determinants of Health   Financial Resource Strain:   . Difficulty of Paying Living Expenses: Not on file  Food Insecurity:   . Worried About Programme researcher, broadcasting/film/video in the Last Year: Not on file  . Ran Out of Food in the Last Year: Not on file  Transportation Needs:   . Lack of Transportation (Medical): Not on file  . Lack of Transportation (Non-Medical): Not on file  Physical Activity:   . Days of Exercise per Week: Not on file  . Minutes of Exercise per Session: Not on  file  Stress:   . Feeling of Stress : Not on file  Social Connections:   . Frequency of Communication with Friends and Family: Not on file  . Frequency of Social Gatherings with Friends and Family: Not on file  . Attends Religious Services: Not on file  . Active Member of Clubs or Organizations: Not on file  . Attends Banker Meetings: Not on file  . Marital Status: Not on file  Intimate Partner Violence:   . Fear of Current or Ex-Partner: Not on file  . Emotionally Abused: Not on file  . Physically Abused: Not on file  . Sexually Abused: Not on file     Current Outpatient Medications:  .  ALPRAZolam (XANAX) 0.25 MG tablet, TAKE 1 TABLET (0.25 MG TOTAL) BY MOUTH 2 (TWO) TIMES DAILY AS NEEDED  FOR ANXIETY (PANIC)., Disp: 20 tablet, Rfl: 2 .  amLODipine (NORVASC) 5 MG tablet, Take 1 tablet (5 mg total) by mouth daily., Disp: 90 tablet, Rfl: 3 .  BLISOVI FE 1/20 1-20 MG-MCG tablet, TAKE 1 TABLET BY MOUTH DAILY., Disp: 84 tablet, Rfl: 3 .  meloxicam (MOBIC) 15 MG tablet, Take 1 tablet daily for 14 days then take 1 tablet daily as needed., Disp: 30 tablet, Rfl: 1 .  PARoxetine (PAXIL) 10 MG tablet, Take 1 tablet (10 mg total) by mouth daily., Disp: 90 tablet, Rfl: 3 .  clindamycin (CLEOCIN) 300 MG capsule, Take 1 capsule (300 mg total) by mouth 4 (four) times daily for 7 days., Disp: 28 capsule, Rfl: 0 .  traMADol (ULTRAM) 50 MG tablet, Take 1-2 tablets (50-100 mg total) by mouth every 8 (eight) hours as needed for moderate pain. Maximum 6 tabs per day. (Patient not taking: Reported on 05/20/2020), Disp: 21 tablet, Rfl: 0  EXAM:  VITALS per patient if applicable: BP (!) 142/88   Temp 98.1 F (36.7 C)   Wt 197 lb (89.4 kg)   BMI 32.78 kg/m   GENERAL: alert, oriented, appears well and in no acute distress  HEENT: atraumatic, conjunttiva clear, no obvious abnormalities on inspection of external nose and ears.  She has tenderness when pressing over R maxillary sinus.  NECK: normal movements of the head and neck  LUNGS: on inspection no signs of respiratory distress, breathing rate appears normal, no obvious gross SOB, gasping or wheezing  CV: no obvious cyanosis  MS: moves all visible extremities without noticeable abnormality  PSYCH/NEURO: pleasant and cooperative, no obvious depression or anxiety, speech and thought processing grossly intact  ASSESSMENT AND PLAN:  Discussed the following assessment and plan:  Acute maxillary sinusitis Start clindamycin 300mg  qid as she has listed allergy to penicillin and doxycycline.  She is unsure if she has taken cephalosporins safely previously.  Continue supportive care with good hydration and rest.   Instructed to contact clinic if  symptoms persist or worsen.        I discussed the assessment and treatment plan with the patient. The patient was provided an opportunity to ask questions and all were answered. The patient agreed with the plan and demonstrated an understanding of the instructions.   The patient was advised to call back or seek an in-person evaluation if the symptoms worsen or if the condition fails to improve as anticipated.    , DO

## 2020-05-22 DIAGNOSIS — Z20828 Contact with and (suspected) exposure to other viral communicable diseases: Secondary | ICD-10-CM | POA: Diagnosis not present

## 2020-05-30 ENCOUNTER — Ambulatory Visit: Payer: 59 | Admitting: Podiatry

## 2020-06-11 MED FILL — BLISOVI FE 1/20 1-20 MG-MCG: 1-20 | 84 days supply | Qty: 84 | Fill #1

## 2020-06-13 ENCOUNTER — Ambulatory Visit (INDEPENDENT_AMBULATORY_CARE_PROVIDER_SITE_OTHER): Payer: 59 | Admitting: Podiatry

## 2020-06-13 ENCOUNTER — Encounter: Payer: Self-pay | Admitting: Podiatry

## 2020-06-13 ENCOUNTER — Other Ambulatory Visit: Payer: Self-pay

## 2020-06-13 DIAGNOSIS — M958 Other specified acquired deformities of musculoskeletal system: Secondary | ICD-10-CM

## 2020-06-13 DIAGNOSIS — M19071 Primary osteoarthritis, right ankle and foot: Secondary | ICD-10-CM | POA: Diagnosis not present

## 2020-06-13 NOTE — Progress Notes (Signed)
Subjective:   Patient ID: Rachel Levy, female   DOB: 39 y.o.   MRN: [id number]   HPI 39 year old female presents the office today with concerns of chronic ankle pain with the right side worse than left. This is been ongoing for last 2 years and she denies any recent injury. She states that she has swelling to the ankle which is been chronic and she has significant discomfort to the point where it is limiting her quality of life, daily activities. She not able to be active because of discomfort. She has previously had 2 injections into her ankle and she states this provides about 50% relief. She does wear a brace on either his ankle if it sore. She presents today for further discussion for surgical consultation.  Review of Systems  All other systems reviewed and are negative.  Past Medical History:  Diagnosis Date  . Anxiety     Past Surgical History:  Procedure Laterality Date  . no history of surgeries       Current Outpatient Medications:  .  ALPRAZolam (XANAX) 0.25 MG tablet, TAKE 1 TABLET (0.25 MG TOTAL) BY MOUTH 2 (TWO) TIMES DAILY AS NEEDED FOR ANXIETY (PANIC)., Disp: 20 tablet, Rfl: 2 .  amLODipine (NORVASC) 5 MG tablet, Take 1 tablet (5 mg total) by mouth daily., Disp: 90 tablet, Rfl: 3 .  BLISOVI FE 1/20 1-20 MG-MCG tablet, TAKE 1 TABLET BY MOUTH DAILY., Disp: 84 tablet, Rfl: 3 .  meloxicam (MOBIC) 15 MG tablet, Take 1 tablet daily for 14 days then take 1 tablet daily as needed., Disp: 30 tablet, Rfl: 1 .  PARoxetine (PAXIL) 10 MG tablet, Take 1 tablet (10 mg total) by mouth daily., Disp: 90 tablet, Rfl: 3  Allergies  Allergen Reactions  . Penicillins Hives  . Doxycycline Rash  . Mircette [Desogestrel-Ethinyl Estradiol] Nausea And Vomiting  . Nuvaring [Etonogestrel-Ethinyl Estradiol] Other (See Comments)    Polyphagia, low libido, mood changes         Objective:  Physical Exam  General: AAO x3, NAD  Dermatological: Skin is warm, dry and supple bilateral. There  are no open sores, no preulcerative lesions, no rash or signs of infection present.  Vascular: Dorsalis Pedis artery and Posterior Tibial artery pedal pulses are 2/4 bilateral with immedate capillary fill time. There is no pain with calf compression, swelling, warmth, erythema.   Neruologic: Grossly intact via light touch bilateral.   Musculoskeletal: There is global tenderness to the ankle joint mostly on the anterior medial, anterior lateral aspect of the ankle there is chronic edema present to lateral aspect ankle with right side worse than left. Muscular strength 5/5 in all groups tested bilateral. Does appear to have some instability with increased anterior drawer sign although previous MRI was negative for tear of the lateral ankle ligaments.  Gait: Unassisted, Nonantalgic.       Assessment:   Large osteochondral lesion with edema right ankle     Plan:  -Treatment options discussed including all alternatives, risks, and complications -Etiology of symptoms were discussed -I reviewed her previous x-rays as well as MRI and I showed her the MRI. 13 x 9 mm area of subcortical cyst formation in the medial aspect of the dome extending deep into the medial aspect of the talus. I discussed with her surgical intervention but prior to the subacute CT scan for further evaluation. We discussed potential surgical options including arthroscopy versus medial nail takedown with curettage, grafting of the lesion. However given the size of  the lesion unfortunately she is likely need to have an ankle fusion or total ankle replacement later on but hopefully we can provide her with relief to get her back in a more quality of life, daily activities.  Vivi Barrack DPM

## 2020-06-17 ENCOUNTER — Other Ambulatory Visit: Payer: Self-pay

## 2020-06-17 ENCOUNTER — Encounter: Payer: Self-pay | Admitting: Osteopathic Medicine

## 2020-06-17 ENCOUNTER — Ambulatory Visit (INDEPENDENT_AMBULATORY_CARE_PROVIDER_SITE_OTHER): Payer: 59

## 2020-06-17 ENCOUNTER — Other Ambulatory Visit: Payer: Self-pay | Admitting: Osteopathic Medicine

## 2020-06-17 ENCOUNTER — Ambulatory Visit (INDEPENDENT_AMBULATORY_CARE_PROVIDER_SITE_OTHER): Payer: 59 | Admitting: Osteopathic Medicine

## 2020-06-17 VITALS — BP 131/84 | HR 86 | Wt 199.0 lb

## 2020-06-17 DIAGNOSIS — M24171 Other articular cartilage disorders, right ankle: Secondary | ICD-10-CM | POA: Diagnosis not present

## 2020-06-17 DIAGNOSIS — M898X7 Other specified disorders of bone, ankle and foot: Secondary | ICD-10-CM | POA: Diagnosis not present

## 2020-06-17 DIAGNOSIS — M958 Other specified acquired deformities of musculoskeletal system: Secondary | ICD-10-CM

## 2020-06-17 DIAGNOSIS — Z01818 Encounter for other preprocedural examination: Secondary | ICD-10-CM

## 2020-06-17 DIAGNOSIS — F411 Generalized anxiety disorder: Secondary | ICD-10-CM | POA: Diagnosis not present

## 2020-06-17 DIAGNOSIS — I9788 Other intraoperative complications of the circulatory system, not elsewhere classified: Secondary | ICD-10-CM | POA: Diagnosis not present

## 2020-06-17 DIAGNOSIS — Z23 Encounter for immunization: Secondary | ICD-10-CM | POA: Diagnosis not present

## 2020-06-17 DIAGNOSIS — M19071 Primary osteoarthritis, right ankle and foot: Secondary | ICD-10-CM

## 2020-06-17 DIAGNOSIS — R600 Localized edema: Secondary | ICD-10-CM | POA: Diagnosis not present

## 2020-06-17 DIAGNOSIS — Z0000 Encounter for general adult medical examination without abnormal findings: Secondary | ICD-10-CM

## 2020-06-17 DIAGNOSIS — G8929 Other chronic pain: Secondary | ICD-10-CM

## 2020-06-17 DIAGNOSIS — I10 Essential (primary) hypertension: Secondary | ICD-10-CM

## 2020-06-17 DIAGNOSIS — M25571 Pain in right ankle and joints of right foot: Secondary | ICD-10-CM | POA: Diagnosis not present

## 2020-06-17 MED ORDER — PAROXETINE HCL 10 MG PO TABS: 10.0000 mg | ORAL_TABLET | Freq: Every day | ORAL | 3 refills | Status: DC

## 2020-06-17 MED ORDER — TRAMADOL HCL 50 MG PO TABS
50.0000 mg | ORAL_TABLET | Freq: Three times a day (TID) | ORAL | 0 refills | Status: DC | PRN
Start: 2020-06-17 — End: 2020-09-19

## 2020-06-17 MED ORDER — AMLODIPINE BESYLATE 5 MG PO TABS: 5.0000 mg | ORAL_TABLET | Freq: Every day | ORAL | 3 refills | Status: DC

## 2020-06-17 MED FILL — traMADol HCL 50 MG TABS: 50 | 5 days supply | Qty: 30 | Fill #0

## 2020-06-17 NOTE — Patient Instructions (Addendum)
General Preventive Care  Most recent routine screening labs: ordered.   Blood pressure goal 130/80 or less.   Tobacco: don't!   Alcohol: responsible moderation is ok for most adults - if you have concerns about your alcohol intake, please talk to me!   Exercise: as tolerated to reduce risk of cardiovascular disease and diabetes. Strength training will also prevent osteoporosis.   Mental health: if need for mental health care (medicines, counseling, other), or concerns about moods, please let me know!   Sexual / Reproductive health: if need for STD testing, or if concerns with libido/pain problems, please let me know! If you need to discuss family planning, please let me know!    Advanced Directive: Living Will and/or Healthcare Power of Attorney recommended for all adults, regardless of age or health.  Vaccines  Flu vaccine: for almost everyone, every fall.   Shingles vaccine: after age 66.   Pneumonia vaccines: after age 13.  Tetanus booster: every 10 years / 3rd trimester of pregnancy  COVID vaccine: THANKS for getting your vaccine! :) Cancer screenings   Colon cancer screening: for everyone age 71-75.   Breast cancer screening: mammogram age 43-75  Cervical cancer screening: Pap every 1 to 5 years depending on age and other risk factors. Can usually stop at age 53 or w/ hysterectomy.   Lung cancer screening: CT chest every year for those aged 41 to 80 years who have a 20 pack-year smoking history and currently smoke or have quit within the past 15 years  Infection screenings   HIV: recommended screening at least once age 62-65  Gonorrhea/Chlamydia: screening as needed  Hepatitis C: recommended once for everyone age 66-75  TB: certain at-risk populations, or depending on work requirements and/or travel history Other  Bone Density Test: recommended for women at age 37

## 2020-06-17 NOTE — Progress Notes (Signed)
Rachel Levy is a 38 y.o. female who presents to  [facility] at [facility]  today, 06/17/20, seeking care for the following:  . ANNUAL PHYSICAL . BLOOD PRESSURE - concerned about upcoming sugery, last surgery she had high BP postop  . ANKLE PAIN - impacting ability to sleep, following w/ ortho, requests refill of Tramadol to help severe pain       ASSESSMENT & PLAN with other pertinent findings:  The primary encounter diagnosis was Annual physical exam. Diagnoses of Flu vaccine need, GAD (generalized anxiety disorder), Hypertension goal BP (blood pressure) < 130/80, Perioperative hypertension, Encounter for perioperative consultation, and Osteochondral defect of talus were also pertinent to this visit.   No results found for this or any previous visit (from the past 24 hour(s)).  1. Flu vaccine need done  2. Annual physical exam See below  3. GAD (generalized anxiety disorder) conitnue Rx  4. Hypertension goal BP (blood pressure) < 130/80 5. Perioperative hypertension 6. Encounter for perioperative consultation Continue Rx, alert anesthesia team of previous BP issues   7. Osteochondral defect of talus Refill limited 5 day supply Tramadol Further management per ortho, pt instructed to call them about pain issues and they can refill Rx / plan surgery as appropriate    Patient Instructions  General Preventive Care  Most recent routine screening labs: ordered.   Blood pressure goal 130/80 or less.   Tobacco: don't!   Alcohol: responsible moderation is ok for most adults - if you have concerns about your alcohol intake, please talk to me!   Exercise: as tolerated to reduce risk of cardiovascular disease and diabetes. Strength training will also prevent osteoporosis.   Mental health: if need for mental health care (medicines, counseling, other), or concerns about moods, please let me know!   Sexual / Reproductive health: if  need for STD testing, or if concerns with libido/pain problems, please let me know! If you need to discuss family planning, please let me know!    Advanced Directive: Living Will and/or Healthcare Power of Attorney recommended for all adults, regardless of age or health.  Vaccines  Flu vaccine: for almost everyone, every fall.   Shingles vaccine: after age 63.   Pneumonia vaccines: after age 46.  Tetanus booster: every 10 years / 3rd trimester of pregnancy  COVID vaccine: THANKS for getting your vaccine! :) Cancer screenings   Colon cancer screening: for everyone age 82-75.   Breast cancer screening: mammogram age 46-75  Cervical cancer screening: Pap every 1 to 5 years depending on age and other risk factors. Can usually stop at age 76 or w/ hysterectomy.   Lung cancer screening: CT chest every year for those aged 32 to 80 years who have a 20 pack-year smoking history and currently smoke or have quit within the past 15 years  Infection screenings  . HIV: recommended screening at least once age 57-65 . Gonorrhea/Chlamydia: screening as needed . Hepatitis C: recommended once for everyone age 54-75 . TB: certain at-risk populations, or depending on work requirements and/or travel history Other . Bone Density Test: recommended for women at age 81    Orders Placed This Encounter  Procedures  . Flu Vaccine QUAD 6+ mos PF IM (Fluarix Quad PF)  . CBC  . COMPLETE METABOLIC PANEL WITH GFR  . Lipid panel  . INR/PT    Meds ordered this encounter  Medications  . traMADol (ULTRAM) 50 MG tablet    Sig: Take  1-2 tablets (50-100 mg total) by mouth every 8 (eight) hours as needed for moderate pain or severe pain. Maximum 6 tabs per day.    Dispense:  30 tablet    Refill:  0  . PARoxetine (PAXIL) 10 MG tablet    Sig: Take 1 tablet (10 mg total) by mouth daily.    Dispense:  90 tablet    Refill:  3  . amLODipine (NORVASC) 5 MG tablet    Sig: Take 1 tablet (5 mg total) by mouth  daily.    Dispense:  90 tablet    Refill:  3   Constitutional:  . VSS, see nurse notes . General Appearance: alert, well-developed, well-nourished, NAD Eyes: Marland Kitchen Normal lids and conjunctive, non-icteric sclera Neck: . No masses, trachea midline . No thyroid enlargement/tenderness/mass appreciated Respiratory: . Normal respiratory effort . Breath sounds normal, no wheeze/rhonchi/rales Cardiovascular: . S1/S2 normal, no murmur/rub/gallop auscultated . No carotid bruit or JVD . No lower extremity edema Gastrointestinal: . Nontender, no masses . No hepatomegaly, no splenomegaly . No hernia appreciated Musculoskeletal:  . Gait normal . No clubbing/cyanosis of digits Neurological: . No cranial nerve deficit on limited exam . Motor and sensation intact and symmetric Psychiatric: . Normal judgment/insight . Normal mood and affect     Follow-up instructions: Return in about 1 year (around 06/17/2021) for [place] (call week prior to visit for lab orders).                                         BP 131/84 (BP Location: Right Arm, Patient Position: Sitting)   Pulse 86   Wt 199 lb (90.3 kg)   SpO2 98%   BMI 33.12 kg/m   Current Meds  Medication Sig  . ALPRAZolam (XANAX) 0.25 MG tablet TAKE 1 TABLET (0.25 MG TOTAL) BY MOUTH 2 (TWO) TIMES DAILY AS NEEDED FOR ANXIETY (PANIC).  Marland Kitchen amLODipine (NORVASC) 5 MG tablet Take 1 tablet (5 mg total) by mouth daily.  Marland Kitchen BLISOVI FE 1/20 1-20 MG-MCG tablet TAKE 1 TABLET BY MOUTH DAILY.  . meloxicam (MOBIC) 15 MG tablet Take 1 tablet daily for 14 days then take 1 tablet daily as needed.  Marland Kitchen PARoxetine (PAXIL) 10 MG tablet Take 1 tablet (10 mg total) by mouth daily.  . [DISCONTINUED] amLODipine (NORVASC) 5 MG tablet Take 1 tablet (5 mg total) by mouth daily.  . [DISCONTINUED] PARoxetine (PAXIL) 10 MG tablet Take 1 tablet (10 mg total) by mouth daily.    No results found for this or any previous visit (from the  past 72 hour(s)).  No results found.     All questions at time of visit were answered - patient instructed to contact office with any additional concerns or updates.  ER/RTC precautions were reviewed with the patient as applicable.   Please note: voice recognition software was used to produce this document, and typos may escape review. Please contact Dr. Lyn Hollingshead for any needed clarifications.

## 2020-06-24 MED FILL — AMLODIPINE BESYLATE 5 MG TA: 5 | 90 days supply | Qty: 90 | Fill #0

## 2020-07-10 MED FILL — AMLODIPINE BESYLATE 5 MG TA: 5 | 90 days supply | Qty: 90 | Fill #0

## 2020-07-21 ENCOUNTER — Other Ambulatory Visit: Payer: Self-pay

## 2020-07-21 ENCOUNTER — Ambulatory Visit (INDEPENDENT_AMBULATORY_CARE_PROVIDER_SITE_OTHER): Payer: 59 | Admitting: Podiatry

## 2020-07-21 DIAGNOSIS — M958 Other specified acquired deformities of musculoskeletal system: Secondary | ICD-10-CM

## 2020-07-21 DIAGNOSIS — M19071 Primary osteoarthritis, right ankle and foot: Secondary | ICD-10-CM

## 2020-07-21 NOTE — Patient Instructions (Signed)

## 2020-07-21 NOTE — Progress Notes (Signed)
Subjective: 39 year old female presents the office today for surgical consultation given chronic right ankle pain.  She previously had an MRI performed I also ordered CT scan which revealed stable chronic stable chondral lesion of the medial talar dome with large defect measuring 15 x 8 mm.  She is a chronic pain she not able to do daily activities given the discomfort and pain.  She is previously had conservative treatment with sports medicine including bracing, injection therapy there is significant improvement.  This time she required surgical intervention. Denies any systemic complaints such as fevers, chills, nausea, vomiting. No acute changes since last appointment, and no other complaints at this time.   Objective: AAO x3, NAD DP/PT pulses palpable bilaterally, CRT less than 3 seconds Overall exam is unchanged.  There is still chronic tenderness to palpation along the anterior medial aspect of the ankle joint.  There is also mild discomfort to the lateral aspect ankle joint with minimal edema.  There is no erythema or warmth.  No crepitation with ankle joint range of motion.  No pain with subtalar joint.  MMT 5/5. No pain with calf compression, swelling, warmth, erythema  Assessment: 39 year old female left ankle osteochondral lesion  Plan: -All treatment options discussed with the patient including all alternatives, risks, complications.  -Again reviewed the CT scans with MRI.  We discussed conservative well surgical treatment elects proceed with surgery.  We discussed ankle joint arthroscopy with repair of subchondral lesion and then possible medial malleolar osteotomy; bone graft. -The incision placement as well as the postoperative course was discussed with the patient. I discussed risks of the surgery which include, but not limited to, infection, bleeding, pain, swelling, need for further surgery, delayed or nonhealing, painful or ugly scar, numbness or sensation changes, over/under  correction, recurrence, transfer lesions, further deformity, hardware failure, DVT/PE, loss of toe/foot. Patient understands these risks and wishes to proceed with surgery. The surgical consent was reviewed with the patient all 3 pages were signed. No promises or guarantees were given to the outcome of the procedure. All questions were answered to the best of my ability. Before the surgery the patient was encouraged to call the office if there is any further questions. The surgery will be performed at the [facility] on an outpatient basis. -Patient encouraged to call the office with any questions, concerns, change in symptoms.   Vivi Barrack DPM

## 2020-07-28 ENCOUNTER — Other Ambulatory Visit: Payer: Self-pay | Admitting: Osteopathic Medicine

## 2020-07-28 DIAGNOSIS — F411 Generalized anxiety disorder: Secondary | ICD-10-CM

## 2020-07-28 MED FILL — PARoxetine HCL 10 MG TABS: 10 | 90 days supply | Qty: 90 | Fill #0

## 2020-07-28 MED FILL — ALPRAZolam 0.25 MG TABS: 0.25 | 10 days supply | Qty: 20 | Fill #0

## 2020-07-29 ENCOUNTER — Telehealth: Payer: Self-pay

## 2020-07-29 NOTE — Telephone Encounter (Signed)
DOS 08/13/2020  ANKLE ARTHROTOMY RT - 27610 EXC BONE CYST RT - 28102 OSTEOTOMY PROIMAL TIBIA RT - 27455 ARTHROSCOPY ANKLE RT - 43735  [facility] EFFECTIVE DATE - 09/07/2019  PLAN DEDUCTIBLE - $3000.00 W/ $0.00 REMAINING OUT OF POCKET - $8000.00 W/ $7897.84 REMAINING COPAY $0.00 COINSURANCE - 80%  SPOKE TO EMILY AT [facility] STATED NO PRECERT REQUIRED FOR CPT X431100, [id number], [id number] OR 78412. CALL REF # [id number]

## 2020-08-13 ENCOUNTER — Encounter: Payer: Self-pay | Admitting: Podiatry

## 2020-08-13 ENCOUNTER — Other Ambulatory Visit: Payer: Self-pay | Admitting: Podiatry

## 2020-08-13 DIAGNOSIS — M93971 Osteochondropathy, unspecified, right ankle and foot: Secondary | ICD-10-CM | POA: Diagnosis not present

## 2020-08-13 DIAGNOSIS — M65871 Other synovitis and tenosynovitis, right ankle and foot: Secondary | ICD-10-CM | POA: Diagnosis not present

## 2020-08-13 DIAGNOSIS — M958 Other specified acquired deformities of musculoskeletal system: Secondary | ICD-10-CM | POA: Diagnosis not present

## 2020-08-13 DIAGNOSIS — M25571 Pain in right ankle and joints of right foot: Secondary | ICD-10-CM | POA: Diagnosis not present

## 2020-08-13 DIAGNOSIS — M7752 Other enthesopathy of left foot: Secondary | ICD-10-CM | POA: Diagnosis not present

## 2020-08-13 DIAGNOSIS — M19071 Primary osteoarthritis, right ankle and foot: Secondary | ICD-10-CM | POA: Diagnosis not present

## 2020-08-13 MED ORDER — PROMETHAZINE HCL 25 MG PO TABS: 25.0000 mg | ORAL_TABLET | Freq: Three times a day (TID) | ORAL | 0 refills | Status: DC | PRN

## 2020-08-13 MED ORDER — OXYCODONE-ACETAMINOPHEN 5-325 MG PO TABS: 1.0000 | ORAL_TABLET | Freq: Four times a day (QID) | ORAL | 0 refills | Status: DC | PRN

## 2020-08-13 MED ORDER — CLINDAMYCIN HCL 300 MG PO CAPS: 300.0000 mg | ORAL_CAPSULE | Freq: Three times a day (TID) | ORAL | 0 refills | Status: DC

## 2020-08-13 MED FILL — CLINDAMYCIN HCL 300 MG CAPS: 300 | 3 days supply | Qty: 9 | Fill #0

## 2020-08-13 MED FILL — PROMETHAZINE 25 MG TABLET: 25 | 7 days supply | Qty: 20 | Fill #0

## 2020-08-13 MED FILL — OXYCODONE-APAP 5-325MG: 5-325 | 4 days supply | Qty: 30 | Fill #0

## 2020-08-13 NOTE — Progress Notes (Signed)
Postop medications sent 

## 2020-08-15 ENCOUNTER — Telehealth: Payer: Self-pay | Admitting: *Deleted

## 2020-08-15 NOTE — Telephone Encounter (Signed)
Called and spoke with the patient and stated that I was calling to see how the patient was doing after surgery with Dr Ardelle Anton on 08/13/2020 and patient stated that she was doing ok and could wiggle the toes and there was not any fever or chills and not any nausea and is painful and I stated that was expected and to elevate and ice 15 minutes on the hour and not to be on it a lot the first week and could take the boot off and to put back on when getting up and call the office if any concerns or questions. Rachel Levy

## 2020-08-18 ENCOUNTER — Other Ambulatory Visit: Payer: Self-pay | Admitting: Podiatry

## 2020-08-18 ENCOUNTER — Ambulatory Visit (INDEPENDENT_AMBULATORY_CARE_PROVIDER_SITE_OTHER): Payer: 59 | Admitting: Podiatry

## 2020-08-18 ENCOUNTER — Other Ambulatory Visit: Payer: Self-pay

## 2020-08-18 ENCOUNTER — Ambulatory Visit (INDEPENDENT_AMBULATORY_CARE_PROVIDER_SITE_OTHER): Payer: 59

## 2020-08-18 DIAGNOSIS — M19071 Primary osteoarthritis, right ankle and foot: Secondary | ICD-10-CM

## 2020-08-18 DIAGNOSIS — M958 Other specified acquired deformities of musculoskeletal system: Secondary | ICD-10-CM

## 2020-08-18 MED ORDER — OXYCODONE-ACETAMINOPHEN 10-325 MG PO TABS: 1.0000 | ORAL_TABLET | Freq: Four times a day (QID) | ORAL | 0 refills | Status: DC | PRN

## 2020-08-18 MED FILL — OXYCODONE-APAP 10-325: 10-325 | 4 days supply | Qty: 15 | Fill #0

## 2020-08-19 ENCOUNTER — Telehealth: Payer: Self-pay

## 2020-08-19 NOTE — Telephone Encounter (Signed)
Placed order for a knee scooter with [employer] [facility]

## 2020-08-25 ENCOUNTER — Other Ambulatory Visit: Payer: Self-pay | Admitting: Podiatry

## 2020-08-25 ENCOUNTER — Telehealth: Payer: Self-pay | Admitting: Podiatry

## 2020-08-25 DIAGNOSIS — M19071 Primary osteoarthritis, right ankle and foot: Secondary | ICD-10-CM

## 2020-08-25 NOTE — Telephone Encounter (Signed)
I have put it back in [place]- can you please put it in for [facility].

## 2020-08-25 NOTE — Telephone Encounter (Signed)
Pt states [employer] [facility] never received an order for her knee scooter. She thinks it may be lost somewhere. Please advise.

## 2020-08-25 NOTE — Progress Notes (Signed)
Subjective: Rachel Levy is a 39 y.o. is seen today in office s/p left ankle medial malleolar osteotomy with osteochondral repair preformed on 08/13/2020.  Some discomfort to the ankle she is taken the medicine still for this.  She has been nonweightbearing in the cam boot.  She said the left ankle is been feeling great since the injection.  Denies any systemic complaints such as fevers, chills, nausea, vomiting. No calf pain, chest pain, shortness of breath.   Objective: General: No acute distress, AAOx3  DP/PT pulses palpable 2/4, CRT < 3 sec to all digits.  Protective sensation intact. Motor function intact.  LEFT foot: Incision is well coapted without any evidence of dehiscence with sutures intact. There is no surrounding erythema, ascending cellulitis, fluctuance, crepitus, malodor, drainage/purulence. There is mild edema around the surgical site. There is mild pain along the surgical site.  No other areas of tenderness to bilateral lower extremities.  No other open lesions or pre-ulcerative lesions.  No pain with calf compression, swelling, warmth, erythema.   Assessment and Plan:  Status post left ankle surgery, doing well with no complications   -Treatment options discussed including all alternatives, risks, and complications -X-rays obtained reviewed.  Status post medial malleolus and screw fixation.  There is no evidence of acute fracture. -Antibiotic ointment and a bandage applied.  A well-padded below-knee fiberglass cast was applied making sure to pad all bony prominences. -Nonweightbearing -Ice/elevation -Pain medication as needed. -Monitor for any clinical signs or symptoms of infection and DVT/PE and directed to call the office immediately should any occur or go to the ER. -Follow-up as scheduled for cast change or sooner if any problems arise. In the meantime, encouraged to call the office with any questions, concerns, change in symptoms.   Ovid Curd, DPM

## 2020-08-28 ENCOUNTER — Other Ambulatory Visit: Payer: Self-pay

## 2020-08-28 ENCOUNTER — Ambulatory Visit (INDEPENDENT_AMBULATORY_CARE_PROVIDER_SITE_OTHER): Payer: 59 | Admitting: Podiatry

## 2020-08-28 ENCOUNTER — Encounter: Payer: Self-pay | Admitting: Podiatry

## 2020-08-28 DIAGNOSIS — M958 Other specified acquired deformities of musculoskeletal system: Secondary | ICD-10-CM

## 2020-08-28 DIAGNOSIS — M19071 Primary osteoarthritis, right ankle and foot: Secondary | ICD-10-CM

## 2020-09-01 ENCOUNTER — Encounter: Payer: Self-pay | Admitting: Podiatry

## 2020-09-02 ENCOUNTER — Ambulatory Visit (INDEPENDENT_AMBULATORY_CARE_PROVIDER_SITE_OTHER): Payer: 59 | Admitting: Podiatry

## 2020-09-02 ENCOUNTER — Ambulatory Visit (INDEPENDENT_AMBULATORY_CARE_PROVIDER_SITE_OTHER): Payer: 59

## 2020-09-02 ENCOUNTER — Other Ambulatory Visit: Payer: Self-pay

## 2020-09-02 DIAGNOSIS — M958 Other specified acquired deformities of musculoskeletal system: Secondary | ICD-10-CM

## 2020-09-02 DIAGNOSIS — M19071 Primary osteoarthritis, right ankle and foot: Secondary | ICD-10-CM

## 2020-09-03 ENCOUNTER — Telehealth: Payer: Self-pay | Admitting: Podiatry

## 2020-09-03 ENCOUNTER — Other Ambulatory Visit: Payer: Self-pay | Admitting: Podiatry

## 2020-09-03 MED ORDER — OXYCODONE-ACETAMINOPHEN 10-325 MG PO TABS: 1.0000 | ORAL_TABLET | Freq: Four times a day (QID) | ORAL | 0 refills | Status: DC | PRN

## 2020-09-03 MED FILL — BLISOVI FE 1/20 1-20 MG-MCG: 1-20 | 84 days supply | Qty: 84 | Fill #2

## 2020-09-03 MED FILL — OXYCODONE-APAP 10-325: 10-325 | 4 days supply | Qty: 15 | Fill #0

## 2020-09-03 NOTE — Progress Notes (Signed)
Subjective: Rachel Levy is a 39 y.o. is seen today in office s/p left ankle medial malleolar osteotomy with osteochondral repair preformed on 08/13/2020.  She states that she has been doing well.  Occasional discomfort.  She has been in the cast.  She can now weight-bear without any recent injury or falls and she has no other concerns today. Denies any systemic complaints such as fevers, chills, nausea, vomiting. No calf pain, chest pain, shortness of breath.   Objective: General: No acute distress, AAOx3  DP/PT pulses palpable 2/4, CRT < 3 sec to all digits.  Protective sensation intact. Motor function intact.  LEFT foot: Cast is clean, dry, intact.  Incision is well coapted without any evidence of dehiscence with sutures intact. There is no surrounding erythema, ascending cellulitis, fluctuance, crepitus, malodor, drainage/purulence. There is mild edema around the surgical site. There is minimal pain along the surgical site.  No other areas of tenderness to bilateral lower extremities.  No other open lesions or pre-ulcerative lesions.  No pain with calf compression, swelling, warmth, erythema.   Assessment and Plan:  Status post left ankle surgery, doing well with no complications   -Treatment options discussed including all alternatives, risks, and complications -Sutures removed today.  Left staples intact.  Antibiotic ointment and bandage applied.  She was placed into a cam boot today.  Encourage nonweightbearing still.  Discussed some limited range of motion exercises. -Continue ice elevation  Vivi Barrack DPM

## 2020-09-03 NOTE — Telephone Encounter (Signed)
Patient said her medication has not been called in.

## 2020-09-03 NOTE — Telephone Encounter (Signed)
sent 

## 2020-09-05 NOTE — Progress Notes (Signed)
Subjective: Rachel Levy is a 39 y.o. is seen today in office s/p left ankle medial malleolar osteotomy with osteochondral repair preformed on 08/13/2020.  She presents the patient is having discomfort to the lateral aspect of the ankle which has not happened before.  Better today.  Started yesterday.  She did have 1 small fall where she put weight on her foot but no significant injury otherwise.  She was in the boot when this occurred.  She is on taking anti-inflammatories for pain. Denies any systemic complaints such as fevers, chills, nausea, vomiting. No calf pain, chest pain, shortness of breath.   Objective: General: No acute distress, AAOx3  DP/PT pulses palpable 2/4, CRT < 3 sec to all digits.  Protective sensation intact. Motor function intact.  LEFT foot: Cast is clean, dry, intact.  Incisions are healing well.  Staples intact the medial incision.  There is some mild tenderness palpation along the lateral aspect of the ankle and localized edema although mild.  There is no specific area of pinpoint tenderness of the fibula.  There is no erythema or warmth associated with this.  No signs of infection. No other open lesions or pre-ulcerative lesions.  No pain with calf compression, swelling, warmth, erythema.   Assessment and Plan:  Status post left ankle surgery, presents for lateral ankle pain  -Treatment options discussed including all alternatives, risks, and complications -X-rays obtained reviewed.  No evidence of acute fracture.  Osteotomy present in the malleolus with 2 screws intact and radiolucent line still evident. -There is some mild edema to the area.  I did do a slight compression wrap to help with the swelling.  Encouraged ice elevation.  The boot does not appear to be rubbing she feels.  We will monitor this discomfort.  It is getting better on its own.  Vivi Barrack DPM

## 2020-09-08 ENCOUNTER — Encounter: Payer: Self-pay | Admitting: Podiatry

## 2020-09-08 ENCOUNTER — Ambulatory Visit (INDEPENDENT_AMBULATORY_CARE_PROVIDER_SITE_OTHER): Payer: 59 | Admitting: Podiatry

## 2020-09-08 ENCOUNTER — Other Ambulatory Visit: Payer: Self-pay

## 2020-09-08 DIAGNOSIS — M19071 Primary osteoarthritis, right ankle and foot: Secondary | ICD-10-CM

## 2020-09-08 DIAGNOSIS — M958 Other specified acquired deformities of musculoskeletal system: Secondary | ICD-10-CM

## 2020-09-08 NOTE — Progress Notes (Signed)
Subjective: Rachel Levy is a 40 y.o. is seen today in office s/p left ankle medial malleolar osteotomy with osteochondral repair preformed on 08/13/2020.  She presents here for staple removal.  She said that overall she is doing better.  Pain is improved since I last saw her.  She is been nonweightbearing in the cam boot.  No recent injury or fall since I last saw her she has no other concerns today. Denies any systemic complaints such as fevers, chills, nausea, vomiting. No calf pain, chest pain, shortness of breath.   Objective: General: No acute distress, AAOx3  DP/PT pulses palpable 2/4, CRT < 3 sec to all digits.  Protective sensation intact. Motor function intact.  LEFT foot: Incisions are healing well.  Staples intact the medial incision.  There is no surrounding erythema, drainage or pus.  There is no ascending cellulitis.  There is minimal edema to the ankle but improved compared to last time I saw her.  No pain with ankle joint range of motion today. No other open lesions or pre-ulcerative lesions.  No pain with calf compression, swelling, warmth, erythema.   Assessment and Plan:  Status post left ankle surgery, improving  -Treatment options discussed including all alternatives, risks, and complications -Staples removed today without complications the incision remained well coapted.  Antibiotic ointment and dressing applied.  She can start to shower tomorrow.  Dry thoroughly apply similar bandage. -NWB -Ice/elevation  Vivi Barrack DPM

## 2020-09-12 ENCOUNTER — Encounter: Payer: 59 | Admitting: Podiatry

## 2020-09-19 ENCOUNTER — Encounter: Payer: Self-pay | Admitting: Osteopathic Medicine

## 2020-09-22 ENCOUNTER — Encounter: Payer: 59 | Admitting: Podiatry

## 2020-09-23 ENCOUNTER — Encounter: Payer: 59 | Admitting: Podiatry

## 2020-09-25 ENCOUNTER — Other Ambulatory Visit: Payer: Self-pay

## 2020-09-25 ENCOUNTER — Ambulatory Visit (INDEPENDENT_AMBULATORY_CARE_PROVIDER_SITE_OTHER): Payer: 59

## 2020-09-25 ENCOUNTER — Ambulatory Visit (INDEPENDENT_AMBULATORY_CARE_PROVIDER_SITE_OTHER): Payer: 59 | Admitting: Podiatry

## 2020-09-25 DIAGNOSIS — M958 Other specified acquired deformities of musculoskeletal system: Secondary | ICD-10-CM

## 2020-09-25 DIAGNOSIS — M19071 Primary osteoarthritis, right ankle and foot: Secondary | ICD-10-CM

## 2020-09-30 ENCOUNTER — Encounter: Payer: Self-pay | Admitting: Podiatry

## 2020-09-30 NOTE — Progress Notes (Signed)
Subjective: Rachel Levy is a 40 y.o. is seen today in office s/p left ankle medial malleolar osteotomy with osteochondral repair preformed on 08/13/2020.  States that she been doing well should not take any pain medication.  She has been nonweightbearing in the cam boot.  Has been doing some gentle range of motion exercises.  No recent or falls or changes otherwise.  Overall states feeling better. Denies any systemic complaints such as fevers, chills, nausea, vomiting. No calf pain, chest pain, shortness of breath.   Objective: General: No acute distress, AAOx3  DP/PT pulses palpable 2/4, CRT < 3 sec to all digits.  Protective sensation intact. Motor function intact.  LEFT foot: Incisions are healing well scars are well formed.  There is minimal edema.  There is no surrounding erythema, drainage or pus or ascending cellulitis.  No signs of infection.  There is no crepitation.  Or pain with ankle range of motion although it is stiff.  No significant tenderness palpation. No other open lesions or pre-ulcerative lesions.  No pain with calf compression, swelling, warmth, erythema.   Assessment and Plan:  Status post left ankle surgery, improving  -Treatment options discussed including all alternatives, risks, and complications -X-rays obtained reviewed.  Hardware intact.  Increased consolidation across the osteotomy of the medial malleolus. -At this time she is started transition to partial weightbearing in the cam boot.  Continue to ice elevate.  Antibiotic ointment dressing applied to the incision.  Recommend moisturizer daily. -Anti-inflammatories as needed.  Vivi Barrack DPM

## 2020-10-01 ENCOUNTER — Encounter: Payer: Self-pay | Admitting: Osteopathic Medicine

## 2020-10-06 ENCOUNTER — Encounter: Payer: Self-pay | Admitting: Podiatry

## 2020-10-07 ENCOUNTER — Other Ambulatory Visit: Payer: Self-pay

## 2020-10-07 ENCOUNTER — Ambulatory Visit (INDEPENDENT_AMBULATORY_CARE_PROVIDER_SITE_OTHER): Payer: 59

## 2020-10-07 ENCOUNTER — Ambulatory Visit (INDEPENDENT_AMBULATORY_CARE_PROVIDER_SITE_OTHER): Payer: 59 | Admitting: Podiatry

## 2020-10-07 DIAGNOSIS — M958 Other specified acquired deformities of musculoskeletal system: Secondary | ICD-10-CM

## 2020-10-07 DIAGNOSIS — R609 Edema, unspecified: Secondary | ICD-10-CM

## 2020-10-07 DIAGNOSIS — M19071 Primary osteoarthritis, right ankle and foot: Secondary | ICD-10-CM | POA: Diagnosis not present

## 2020-10-07 MED FILL — AMLODIPINE BESYLATE 5 MG TA: 5 | 90 days supply | Qty: 90 | Fill #1

## 2020-10-09 ENCOUNTER — Encounter: Payer: 59 | Admitting: Podiatry

## 2020-10-10 ENCOUNTER — Encounter: Payer: Self-pay | Admitting: Osteopathic Medicine

## 2020-10-10 ENCOUNTER — Other Ambulatory Visit: Payer: Self-pay | Admitting: Osteopathic Medicine

## 2020-10-10 ENCOUNTER — Telehealth (INDEPENDENT_AMBULATORY_CARE_PROVIDER_SITE_OTHER): Payer: 59 | Admitting: Osteopathic Medicine

## 2020-10-10 VITALS — Wt 198.0 lb

## 2020-10-10 DIAGNOSIS — F411 Generalized anxiety disorder: Secondary | ICD-10-CM | POA: Diagnosis not present

## 2020-10-10 MED ORDER — SERTRALINE HCL 25 MG PO TABS: 25.0000 mg | ORAL_TABLET | Freq: Every day | ORAL | 0 refills | Status: DC

## 2020-10-10 MED FILL — SERTRALINE HCL 25 MG TABLET: 25 | 90 days supply | Qty: 90 | Fill #0

## 2020-10-10 NOTE — Progress Notes (Signed)
Telemedicine Visit via  Video & Audio (App used: MyChart)   I connected with [name] on 10/10/20 at 8:32 AM  by phone or  telemedicine application as noted above  I verified that I am speaking with or regarding  the correct patient using two identifiers.  Participants: Myself, Dr Sunnie Nielsen DO Patient: Rachel Levy Patient proxy if applicable: none Other, if applicable: none  Patient is at home I am in office at [facility]    I discussed the limitations of evaluation and management  by telemedicine and the availability of in person appointments.  The participant(s) above expressed understanding and  agreed to proceed with this appointment via telemedicine.       History of Present Illness: Rachel Levy is a 40 y.o. female who would like to discuss anxiety    MyChart message 10/01/20 confirmed w/ patient  Tapering off Paxil 10 mg to/ 1/2 tablet every other day  Increased energy, decreased appetite - pt happy about this.  However, anxiety has increased.  Asked for recommendation for medication. Visit scheduled   Previous Rx confirmed w/ patient:  Alprazolam 0.25 mg (5 Rx's of 20 tablets since 02/2019)  Buproprion XL 150 mg (late 2020) Paxil - has been on this since few years ago. Has been off this 3 days total, been on 1/2 tablet every other day for about 8 days to so before that      Observations/Objective: Wt 198 lb (89.8 kg)   BMI 32.95 kg/m  BP Readings from Last 3 Encounters:  06/17/20 131/84  05/20/20 (!) 142/88  04/30/20 137/86   Exam: Normal Speech.  NAD  Lab and Radiology Results No results found for this or any previous visit (from the past 72 hour(s)). DG Ankle Complete Right  Result Date: 10/07/2020 Please see detailed radiograph report in office note.      Assessment and Plan: 40 y.o. female with The encounter diagnosis was GAD (generalized anxiety disorder).  Advised option to 1) stay off Paxil for  another week see if anxiety improves, may be withdrawal effects vs 2) go ahead and start Zoloft now to see if this Rx is a better fit - pt prefers to go ahead and treat underlying GAD either way and this is encouraged!   PDMP not reviewed this encounter. No orders of the defined types were placed in this encounter.  Meds ordered this encounter  Medications  . sertraline (ZOLOFT) 25 MG tablet    Sig: Take 1 tablet (25 mg total) by mouth daily.    Dispense:  90 tablet    Refill:  0   There are no Patient Instructions on file for this visit.  Instructions sent via MyChart.   Follow Up Instructions: Return in about 6 weeks (around 11/21/2020) for VIRTUAL *OR* IN-OFFICE VISIT, RECHECK ANXIETY ON NEW MEDICATION: ZOLOFT .    I discussed the assessment and treatment plan with the patient. The patient was provided an opportunity to ask questions and all were answered. The patient agreed with the plan and demonstrated an understanding of the instructions.   The patient was advised to call back or seek an in-person evaluation if any new concerns, if symptoms worsen or if the condition fails to improve as anticipated.  30 minutes of non-face-to-face time was provided during this encounter.      . . . . . . . . . . . . . Marland Kitchen  Historical information moved to improve visibility of documentation.  Past Medical History:  Diagnosis Date  . Anxiety    Past Surgical History:  Procedure Laterality Date  . no history of surgeries     Social History   Tobacco Use  . Smoking status: Former Smoker    Packs/day: 0.50    Years: 12.00    Pack years: 6.00    Types: Cigarettes    Quit date: 02/12/2012    Years since quitting: 8.6  . Smokeless tobacco: Never Used  Substance Use Topics  . Alcohol use: Yes    Alcohol/week: 3.0 standard drinks    Types: 3 Standard drinks or equivalent per week   family history includes Diabetes in her father and  maternal grandfather; Healthy in her sister; Hyperlipidemia in her maternal aunt, maternal uncle, and mother.  Medications: Current Outpatient Medications  Medication Sig Dispense Refill  . ALPRAZolam (XANAX) 0.25 MG tablet TAKE 1 TABLET (0.25 MG TOTAL) BY MOUTH 2 (TWO) TIMES DAILY AS NEEDED FOR ANXIETY (PANIC). 20 tablet 2  . amLODipine (NORVASC) 5 MG tablet Take 1 tablet (5 mg total) by mouth daily. 90 tablet 3  . BLISOVI FE 1/20 1-20 MG-MCG tablet TAKE 1 TABLET BY MOUTH DAILY. 84 tablet 3  . sertraline (ZOLOFT) 25 MG tablet Take 1 tablet (25 mg total) by mouth daily. 90 tablet 0   No current facility-administered medications for this visit.   Allergies  Allergen Reactions  . Penicillins Hives  . Doxycycline Rash  . Mircette [Desogestrel-Ethinyl Estradiol] Nausea And Vomiting  . Nuvaring [Etonogestrel-Ethinyl Estradiol] Other (See Comments)    Polyphagia, low libido, mood changes     If phone visit, billing and coding can please add appropriate modifier if needed

## 2020-10-12 NOTE — Progress Notes (Signed)
Subjective: Rachel Levy is a 40 y.o. is seen today in office s/p left ankle medial malleolar osteotomy with osteochondral repair preformed on 08/13/2020.  She presents today as she noticed some increased swelling yesterday.  Denies any recent injury or falls.  She has been transition to partial weightbearing as tolerated but wearing the cam boot.  She has no other concerns today.  Denies any fevers, chills, nausea, vomiting.  No calf pain, chest pain, shortness of breath.   Objective: General: No acute distress, AAOx3  DP/PT pulses palpable 2/4, CRT < 3 sec to all digits.  Protective sensation intact. Motor function intact.  LEFT foot: Incisions are healing well scars are well formed.  There does appear to be increased edema compared to last appointment there is no erythema or warmth associated this.  The swelling is apparently better than what it was yesterday.  Mild tenderness palpation on surgical sites.  There is no open lesions or any signs of infection.  Able to move the ankle joint with minimal tenderness. No other open lesions or pre-ulcerative lesions.  No pain with calf compression, swelling, warmth, erythema.   Assessment and Plan:  Status post left ankle surgery, presents for swelling  -Treatment options discussed including all alternatives, risks, and complications -X-rays obtained reviewed.  Hardware intact.  No active complicating factors noted today. -Unna boot was applied today precautions were advised on when to remove this. -Recommend returning nonweightbearing in the cam boot.  Continue ice elevation.  Once the [employer] is removed she is feeling better she can start to transition to [facility] as tolerated but must wear the CAM boot.   Return in about 2 weeks (around 10/21/2020).  Vivi Barrack DPM

## 2020-10-13 ENCOUNTER — Other Ambulatory Visit: Payer: Self-pay

## 2020-10-13 ENCOUNTER — Ambulatory Visit (INDEPENDENT_AMBULATORY_CARE_PROVIDER_SITE_OTHER): Payer: 59 | Admitting: Podiatry

## 2020-10-13 ENCOUNTER — Other Ambulatory Visit: Payer: Self-pay | Admitting: Podiatry

## 2020-10-13 DIAGNOSIS — R609 Edema, unspecified: Secondary | ICD-10-CM | POA: Diagnosis not present

## 2020-10-13 DIAGNOSIS — M958 Other specified acquired deformities of musculoskeletal system: Secondary | ICD-10-CM

## 2020-10-13 MED ORDER — GABAPENTIN 100 MG PO CAPS: 100.0000 mg | ORAL_CAPSULE | Freq: Every day | ORAL | 0 refills | Status: DC

## 2020-10-14 MED FILL — GABAPENTIN 100 MG CAPSULE: 100 | 30 days supply | Qty: 30 | Fill #0

## 2020-10-17 ENCOUNTER — Ambulatory Visit (INDEPENDENT_AMBULATORY_CARE_PROVIDER_SITE_OTHER): Payer: 59

## 2020-10-17 ENCOUNTER — Other Ambulatory Visit: Payer: Self-pay

## 2020-10-17 DIAGNOSIS — R609 Edema, unspecified: Secondary | ICD-10-CM

## 2020-10-17 DIAGNOSIS — M7989 Other specified soft tissue disorders: Secondary | ICD-10-CM | POA: Diagnosis not present

## 2020-10-17 NOTE — Progress Notes (Signed)
Subjective: Rachel Levy is a 40 y.o. is seen today in office s/p left ankle medial malleolar osteotomy with osteochondral repair preformed on 08/13/2020.  She left the Unna boot on for about 24 hours before she moved.  She is having soreness.  She notes yesterday purple discoloration to her foot but this did resolve mostly.  She has been noticing some burning at nighttime as well.  No recent injury or falls.  Denies any fevers, chills, nausea, vomiting.  No calf pain, chest pain, shortness of breath.   Objective: General: No acute distress, AAOx3  DP/PT pulses palpable 2/4, CRT < 3 sec to all digits.  Protective sensation intact. Motor function intact.  LEFT foot: Incisions are healing well scars are well formed.  There is continued edema but has not worsened over the last appointment.  There is a purple hue to the foot and ankle but there is immediate capillary refill time to the toe.  Palpable pulses are evident.  There is no pain with calf compression, erythema or warmth. No other open lesions or pre-ulcerative lesions.  No pain with calf compression, swelling, warmth, erythema.   Assessment and Plan:  Status post left ankle surgery, presents for swelling  -Treatment options discussed including all alternatives, risks, and complications -There are still some swelling present but has some new purple discoloration.  I encouraged her to continue elevation.  A new Unna boot was applied to help with the swelling as well.  Also asked her physical therapy next week to help with her range of motion after that this will also be helpful.  I have ordered a venous duplex to rule out a DVT on the right side although suspicion is low.  Also start low-dose gabapentin at nighttime 100 mg.  Discussed side effects.  Return in about 1 week (around 10/20/2020).  Vivi Barrack DPM

## 2020-10-21 ENCOUNTER — Ambulatory Visit (INDEPENDENT_AMBULATORY_CARE_PROVIDER_SITE_OTHER): Payer: 59

## 2020-10-21 ENCOUNTER — Other Ambulatory Visit: Payer: Self-pay

## 2020-10-21 ENCOUNTER — Ambulatory Visit (INDEPENDENT_AMBULATORY_CARE_PROVIDER_SITE_OTHER): Payer: 59 | Admitting: Podiatry

## 2020-10-21 DIAGNOSIS — M19071 Primary osteoarthritis, right ankle and foot: Secondary | ICD-10-CM

## 2020-10-21 DIAGNOSIS — M958 Other specified acquired deformities of musculoskeletal system: Secondary | ICD-10-CM

## 2020-10-21 DIAGNOSIS — R609 Edema, unspecified: Secondary | ICD-10-CM

## 2020-10-22 ENCOUNTER — Ambulatory Visit (INDEPENDENT_AMBULATORY_CARE_PROVIDER_SITE_OTHER): Payer: 59 | Admitting: Physical Therapy

## 2020-10-22 ENCOUNTER — Encounter: Payer: Self-pay | Admitting: Physical Therapy

## 2020-10-22 DIAGNOSIS — M6281 Muscle weakness (generalized): Secondary | ICD-10-CM | POA: Diagnosis not present

## 2020-10-22 DIAGNOSIS — R262 Difficulty in walking, not elsewhere classified: Secondary | ICD-10-CM

## 2020-10-22 DIAGNOSIS — M25571 Pain in right ankle and joints of right foot: Secondary | ICD-10-CM

## 2020-10-22 DIAGNOSIS — M25871 Other specified joint disorders, right ankle and foot: Secondary | ICD-10-CM

## 2020-10-22 NOTE — Therapy (Signed)
[facility] [place] [address] [place], [place], [address] Phone: [phone]   Fax:  [phone]  Physical Therapy Evaluation  Patient Details  Name: Rachel Levy MRN: [id number] Date of Birth: October 31, 1980 Referring Provider (PT): Ovid Curd   Encounter Date: 10/22/2020   PT End of Session - 10/22/20 1141    Visit Number 1    Number of Visits 12    Date for PT Re-Evaluation 12/03/20    PT Start Time 1015    PT Stop Time 1100    PT Time Calculation (min) 45 min    Activity Tolerance Patient tolerated treatment well    Behavior During Therapy [facility] for tasks assessed/performed           Past Medical History:  Diagnosis Date  . Anxiety     Past Surgical History:  Procedure Laterality Date  . no history of surgeries      There were no vitals filed for this visit.    Subjective Assessment - 10/22/20 1018    Subjective Pt has a chondral lesion on her Rt talus and is s/p Rt OCD talus repair with medial malleolar osteotomy 08/13/20. Pt has been out of work since that time. Plans to follow up with MD in 3 weeks. Pain is worse at end of the day, better with ice and meds    How long can you stand comfortably? Just transitioning off of NWB    How long can you walk comfortably? Just transitioning off of NWB    Patient Stated Goals improve mobility and ROM    Currently in Pain? Yes    Pain Score 1     Pain Location Ankle    Pain Orientation Right    Pain Descriptors / Indicators Aching    Pain Type Surgical pain    Pain Onset More than a month ago    Pain Frequency Intermittent    Aggravating Factors  end of the day    Pain Relieving Factors ice, meds              [facility] PT Assessment - 10/22/20 0001      Assessment   Medical Diagnosis Rt OCD talus repair with medial malleolar osteotomy    Referring Provider (PT) Ovid Curd      Precautions   Required Braces or Orthoses Other Brace/Splint    Other  Brace/Splint Rt surgical boot      Restrictions   Other Position/Activity Restrictions MD states pt is to begin WBAT      Balance Screen   Has the patient fallen in the past 6 months No      Home Environment   Living Environment Private residence    Home Layout Two level      Prior Function   Level of Independence Independent      Observation/Other Assessments   Focus on Therapeutic Outcomes (FOTO)  38 functional status measure      Observation/Other Assessments-Edema    Edema Circumferential   25cm Rt ankle     ROM / Strength   AROM / PROM / Strength AROM;Strength      AROM   AROM Assessment Site Ankle    Right/Left Ankle Right    Right Ankle Dorsiflexion 7    Right Ankle Plantar Flexion 12    Right Ankle Inversion 10    Right Ankle Eversion 12      Strength   Strength Assessment Site Ankle    Right/Left Ankle  Right    Right Ankle Dorsiflexion 3-/5    Right Ankle Plantar Flexion 2+/5    Right Ankle Inversion 3-/5    Right Ankle Eversion 3-/5      Palpation   Palpation comment TTP medial maleolus around incision      Ambulation/Gait   Assistive device Other (Comment)   knee scooter                     Objective measurements completed on examination: See above findings.       [facility] Adult PT Treatment/Exercise - 10/22/20 0001      Exercises   Exercises Ankle      Modalities   Modalities Vasopneumatic      Vasopneumatic   Number Minutes Vasopneumatic  10 minutes    Vasopnuematic Location  Ankle    Vasopneumatic Pressure Medium    Vasopneumatic Temperature  34      Ankle Exercises: Stretches   Gastroc Stretch 2 reps;30 seconds    Gastroc Stretch Limitations seated with towel      Ankle Exercises: Standing   Other Standing Ankle Exercises standing wt shifts to increase Rt LE wt bearing and proprioception x 60 seconds      Ankle Exercises: Seated   Ankle Circles/Pumps AAROM;Right;10 reps    Towel Crunch 5 reps                   PT Education - 10/22/20 1140    Education Details PT POC and goals, HEP    Person(s) Educated Patient    Methods Explanation;Demonstration;Handout    Comprehension Returned demonstration;Verbalized understanding               PT Long Term Goals - 10/22/20 1141      PT LONG TERM GOAL #1   Title Pt will be independent with HEP    Time 6    Period Weeks    Status New    Target Date 12/03/20      PT LONG TERM GOAL #2   Title Pt will improve FOTO to >= 64 to demo improved functional mobility    Time 6    Period Weeks    Status New    Target Date 12/03/20      PT LONG TERM GOAL #3   Title Pt will improve Rt ankle strength to 4/5 to be able to perform IADLs with decreased pain    Time 6    Period Weeks    Status New    Target Date 12/03/20      PT LONG TERM GOAL #4   Title Pt will improve Rt ankle ROM by 10 degrees and all directions to perform gait with decreased pain and abnormalities    Time 6    Period Weeks    Status New    Target Date 12/03/20      PT LONG TERM GOAL #5   Title Pt will perform gait household distances with normal gait pattern and pain <= 2/10    Time 6    Period Weeks    Status New    Target Date 12/03/20                  Plan - 10/22/20 1053    Clinical Impression Statement Pt presents with decreased strength and flexibilitly in Rt ankle, difficulty walking, impaired balance and proprioception and increased pain. pt will benefit from skilled PT to address deficits and improve functional mobility and  independence    Examination-Activity Limitations Sleep;Stairs;Stand;Locomotion Level    Examination-Participation Restrictions Occupation;Community Activity;Driving;Yard Work;Laundry;Meal Prep;Cleaning    Stability/Clinical Decision Making Stable/Uncomplicated    Clinical Decision Making Low    Rehab Potential Good    PT Frequency 2x / week    PT Duration 6 weeks    PT Treatment/Interventions Iontophoresis 4mg /ml  Dexamethasone;Cryotherapy;Electrical Stimulation;Gait training;DME Instruction;Moist Heat;Therapeutic activities;Therapeutic exercise;Balance training;Neuromuscular re-education;Vasopneumatic Device;Taping;Manual techniques;Patient/family education;Stair training;Passive range of motion;Dry needling    PT Next Visit Plan assess HEP, modalities and manual as indicated    PT Home Exercise Plan Access Code: [email or web address]    Consulted and Agree with Plan of Care Patient           Patient will benefit from skilled therapeutic intervention in order to improve the following deficits and impairments:  Abnormal gait,Difficulty walking,Pain,Decreased activity tolerance,Decreased strength,Increased edema,Decreased range of motion  Visit Diagnosis: Muscle weakness (generalized) - Plan: PT plan of care cert/re-cert  Pain in right ankle and joints of right foot - Plan: PT plan of care cert/re-cert  Difficulty in walking, not elsewhere classified - Plan: PT plan of care cert/re-cert  Decreased proprioception of joint of right foot - Plan: PT plan of care cert/re-cert     Problem List Patient Active Problem List   Diagnosis Date Noted  . Acute maxillary sinusitis 05/20/2020  . Calculus of gallbladder without cholecystitis without obstruction 12/13/2019  . Oral contraceptive pill surveillance 04/19/2019  . Epiphora, right 04/19/2019  . Deviated nasal septum 04/19/2019  . Hypertension goal BP (blood pressure) < 130/80 03/15/2019  . GAD (generalized anxiety disorder) 02/08/2019  . Encounter for initial prescription of contraceptive pills 02/08/2019  . Elevated blood pressure reading 02/08/2019  . Osteochondral defect of talus 10/01/2018  . Right lumbar radiculitis 10/01/2018  . Phlebolith 09/29/2018  . Breakthrough bleeding associated with intrauterine device (IUD) 11/17/2017   Calogero Geisen, PT  Omare Bilotta 10/22/2020, 12:34 PM  [facility] [address] [place], [place], [address] Phone: [phone]   Fax:  [phone]  Name: FRAN HECKARD MRN: [id number] Date of Birth: 03-12-1981

## 2020-10-22 NOTE — Patient Instructions (Signed)
Access Code: [email or web address] URL: [email or web address] Date: 10/22/2020 Prepared by: Reggy Eye  Exercises Seated Toe Towel Scrunches - 1 x daily - 7 x weekly - 3 sets - 10 reps Long Sitting Calf Stretch with Strap - 1 x daily - 7 x weekly - 3 sets - 1 reps - 20-30 seconds hold Supine Ankle Circles - 1 x daily - 7 x weekly - 3 sets - 10 reps Side to Side Weight Shift with Counter Support - 1 x daily - 7 x weekly - 3 sets - 10 reps

## 2020-10-24 ENCOUNTER — Ambulatory Visit (INDEPENDENT_AMBULATORY_CARE_PROVIDER_SITE_OTHER): Payer: 59 | Admitting: Physical Therapy

## 2020-10-24 ENCOUNTER — Other Ambulatory Visit: Payer: Self-pay

## 2020-10-24 DIAGNOSIS — M25571 Pain in right ankle and joints of right foot: Secondary | ICD-10-CM | POA: Diagnosis not present

## 2020-10-24 DIAGNOSIS — M25871 Other specified joint disorders, right ankle and foot: Secondary | ICD-10-CM | POA: Diagnosis not present

## 2020-10-24 DIAGNOSIS — R262 Difficulty in walking, not elsewhere classified: Secondary | ICD-10-CM | POA: Diagnosis not present

## 2020-10-24 DIAGNOSIS — M6281 Muscle weakness (generalized): Secondary | ICD-10-CM | POA: Diagnosis not present

## 2020-10-24 NOTE — Therapy (Signed)
[facility] [place] [address] [place], [place], [address] Phone: [phone]   Fax:  [phone]  Physical Therapy Treatment  Patient Details  Name: Rachel Levy MRN: [id number] Date of Birth: 23-May-1981 Referring Provider (PT): Ovid Curd   Encounter Date: 10/24/2020   PT End of Session - 10/24/20 1051    Visit Number 2    Number of Visits 12    Date for PT Re-Evaluation 12/03/20    PT Start Time 1015    PT Stop Time 1100    PT Time Calculation (min) 45 min    Activity Tolerance Patient tolerated treatment well    Behavior During Therapy [facility] for tasks assessed/performed           Past Medical History:  Diagnosis Date  . Anxiety     Past Surgical History:  Procedure Laterality Date  . no history of surgeries      There were no vitals filed for this visit.   Subjective Assessment - 10/24/20 1016    Subjective Pt states she has performed HEP and it is going well    Patient Stated Goals improve mobility and ROM    Currently in Pain? No/denies                             [facility] Adult PT Treatment/Exercise - 10/24/20 0001      Vasopneumatic   Number Minutes Vasopneumatic  10 minutes    Vasopnuematic Location  Ankle    Vasopneumatic Pressure Medium    Vasopneumatic Temperature  34      Manual Therapy   Manual Therapy Passive ROM    Passive ROM Rt ankle PROM in comfortable range      Ankle Exercises: Stretches   Gastroc Stretch 2 reps;30 seconds      Ankle Exercises: Seated   Ankle Circles/Pumps AROM;Right;10 reps    Towel Crunch 5 reps    Marble Pickup x 25 marbles    BAPS Level 2;Sitting;15 reps    Other Seated Ankle Exercises rocker board x 30 DF/PF      Ankle Exercises: Standing   Other Standing Ankle Exercises wt shifts laterally and then in semi tandem 2 x 60 sec each                       PT Long Term Goals - 10/22/20 1141      PT LONG TERM GOAL #1    Title Pt will be independent with HEP    Time 6    Period Weeks    Status New    Target Date 12/03/20      PT LONG TERM GOAL #2   Title Pt will improve FOTO to >= 64 to demo improved functional mobility    Time 6    Period Weeks    Status New    Target Date 12/03/20      PT LONG TERM GOAL #3   Title Pt will improve Rt ankle strength to 4/5 to be able to perform IADLs with decreased pain    Time 6    Period Weeks    Status New    Target Date 12/03/20      PT LONG TERM GOAL #4   Title Pt will improve Rt ankle ROM by 10 degrees and all directions to perform gait with decreased pain and abnormalities    Time 6  Period Weeks    Status New    Target Date 12/03/20      PT LONG TERM GOAL #5   Title Pt will perform gait household distances with normal gait pattern and pain <= 2/10    Time 6    Period Weeks    Status New    Target Date 12/03/20                 Plan - 10/24/20 1051    Clinical Impression Statement Pt with improving ROM and tolerance to wt bearing this session. Continues to be challenged by proprioception with BAPs board and with plantar flexion ROM.    PT Next Visit Plan continue BAPS and rocker board for AAROM and proprioception, standing wt shifts, manual and modalities as indicated    PT Home Exercise Plan Access Code: [email or web address]    Consulted and Agree with Plan of Care Patient           Patient will benefit from skilled therapeutic intervention in order to improve the following deficits and impairments:     Visit Diagnosis: Pain in right ankle and joints of right foot  Muscle weakness (generalized)  Difficulty in walking, not elsewhere classified  Decreased proprioception of joint of right foot     Problem List Patient Active Problem List   Diagnosis Date Noted  . Acute maxillary sinusitis 05/20/2020  . Calculus of gallbladder without cholecystitis without obstruction 12/13/2019  . Oral contraceptive pill surveillance 04/19/2019  .  Epiphora, right 04/19/2019  . Deviated nasal septum 04/19/2019  . Hypertension goal BP (blood pressure) < 130/80 03/15/2019  . GAD (generalized anxiety disorder) 02/08/2019  . Encounter for initial prescription of contraceptive pills 02/08/2019  . Elevated blood pressure reading 02/08/2019  . Osteochondral defect of talus 10/01/2018  . Right lumbar radiculitis 10/01/2018  . Phlebolith 09/29/2018  . Breakthrough bleeding associated with intrauterine device (IUD) 11/17/2017   Garen Woolbright, PT  Cozetta Seif 10/24/2020, 10:55 AM  [facility] [address] [place], [place], [address] Phone: [phone]   Fax:  [phone]  Name: Rachel Levy MRN: [id number] Date of Birth: 1981/01/14

## 2020-10-25 NOTE — Progress Notes (Signed)
Subjective: Rachel Levy is a 40 y.o. is seen today in office s/p left ankle medial malleolar osteotomy with osteochondral repair preformed on 08/13/2020.  Since last week she states that she feels significantly better.  She had some still purple discoloration of mostly after showering in the hot water.  Otherwise she is concerned put some weight on the foot in the cam boot, doing well with this.  No recent injury or falls or changes.  She has no other concerns today. Denies any fevers, chills, nausea, vomiting.  No calf pain, chest pain, shortness of breath.   Objective: General: No acute distress, AAOx3  DP/PT pulses palpable 2/4, CRT < 3 sec to all digits.  Protective sensation intact. Motor function intact.  LEFT foot: Incisions are healing well scars are well formed.  Edema has improved.  There is mild tenderness palpation of the ankle joint on the surgical site.  Majority of tenderness and swelling actually is on the plantar medial aspect of the calcaneus and insertion plantar fascia.  There is no erythema or warmth associate with the swelling.  There is no other areas of discomfort. No other open lesions or pre-ulcerative lesions.  No pain with calf compression, swelling, warmth, erythema.   Assessment and Plan:  Status post left ankle surgery  -Treatment options discussed including all alternatives, risks, and complications -Repeat x-rays obtained reviewed.  Hardware intact and has increased consolidation across the osteotomy site -At this point she is improving compared to last week.  Continue partial weightbearing cam boot.  She is scheduled start physical therapy tomorrow.  Continue ice elevate as well as compression anklet.  Vivi Barrack DPM

## 2020-10-28 ENCOUNTER — Ambulatory Visit (INDEPENDENT_AMBULATORY_CARE_PROVIDER_SITE_OTHER): Payer: 59 | Admitting: Physical Therapy

## 2020-10-28 ENCOUNTER — Other Ambulatory Visit: Payer: Self-pay

## 2020-10-28 DIAGNOSIS — M25571 Pain in right ankle and joints of right foot: Secondary | ICD-10-CM | POA: Diagnosis not present

## 2020-10-28 DIAGNOSIS — M6281 Muscle weakness (generalized): Secondary | ICD-10-CM | POA: Diagnosis not present

## 2020-10-28 DIAGNOSIS — R262 Difficulty in walking, not elsewhere classified: Secondary | ICD-10-CM | POA: Diagnosis not present

## 2020-10-28 DIAGNOSIS — M25871 Other specified joint disorders, right ankle and foot: Secondary | ICD-10-CM

## 2020-10-28 NOTE — Patient Instructions (Signed)
Access Code: [email or web address] URL: [email or web address] Date: 10/28/2020 Prepared by: Reggy Eye  Exercises Seated Toe Towel Scrunches - 1 x daily - 7 x weekly - 3 sets - 10 reps Long Sitting Calf Stretch with Strap - 1 x daily - 7 x weekly - 3 sets - 1 reps - 20-30 seconds hold Supine Ankle Circles - 1 x daily - 7 x weekly - 3 sets - 10 reps Side to Side Weight Shift with Counter Support - 1 x daily - 7 x weekly - 3 sets - 10 reps Long Sitting Ankle Eversion with Resistance - 1 x daily - 7 x weekly - 3 sets - 10 reps Long Sitting Ankle Plantar Flexion with Resistance - 1 x daily - 7 x weekly - 3 sets - 10 reps Long Sitting Ankle Inversion with Resistance - 1 x daily - 7 x weekly - 3 sets - 10 reps Long Sitting Ankle Dorsiflexion with Anchored Resistance - 1 x daily - 7 x weekly - 3 sets - 10 reps

## 2020-10-28 NOTE — Therapy (Signed)
[facility] [employer] [address] [place], [place], [address] Phone: [phone]   Fax:  [phone]  Physical Therapy Treatment  Patient Details  Name: Rachel Levy MRN: [id number] Date of Birth: September 25, 1980 Referring Provider (PT): Ovid Curd   Encounter Date: 10/28/2020   PT End of Session - 10/28/20 0924    Visit Number 3    Number of Visits 12    Date for PT Re-Evaluation 12/03/20    PT Start Time 0845    PT Stop Time 0930    PT Time Calculation (min) 45 min    Activity Tolerance Patient tolerated treatment well    Behavior During Therapy [facility] for tasks assessed/performed           Past Medical History:  Diagnosis Date  . Anxiety     Past Surgical History:  Procedure Laterality Date  . no history of surgeries      There were no vitals filed for this visit.   Subjective Assessment - 10/28/20 0848    Subjective Pt states her ankle is "a little swolen" but feels good overall    Patient Stated Goals improve mobility and ROM    Currently in Pain? No/denies                             [facility] Adult PT Treatment/Exercise - 10/28/20 0001      Ambulation/Gait   Ambulation Distance (Feet) 110 Feet   x2   Assistive device Crutches    Gait Comments focus on partial wt bearing on Rt LE in boot and normalizing gait pattern      Vasopneumatic   Number Minutes Vasopneumatic  10 minutes    Vasopnuematic Location  Ankle    Vasopneumatic Pressure Medium    Vasopneumatic Temperature  34      Ankle Exercises: Seated   Ankle Circles/Pumps AROM;Right;20 reps    Towel Crunch 5 reps    [place] Pickup x 25    BAPS Sitting;Level 2;10 reps    Other Seated Ankle Exercises rocker board A/P 2 x 60 sec      Ankle Exercises: Supine   T-Band ankle 4 way red TB in long sitting x 15 each      Ankle Exercises: Standing   Other Standing Ankle Exercises standing wt shifts laterally and in semi tandem all 2 x 60  secs      Ankle Exercises: Stretches   Gastroc Stretch 2 reps;30 seconds                  PT Education - 10/28/20 0924    Education Details updated HEP    Person(s) Educated Patient    Methods Explanation;Demonstration;Handout    Comprehension Returned demonstration;Verbalized understanding               PT Long Term Goals - 10/22/20 1141      PT LONG TERM GOAL #1   Title Pt will be independent with HEP    Time 6    Period Weeks    Status New    Target Date 12/03/20      PT LONG TERM GOAL #2   Title Pt will improve FOTO to >= 64 to demo improved functional mobility    Time 6    Period Weeks    Status New    Target Date 12/03/20      PT LONG TERM GOAL #3   Title  Pt will improve Rt ankle strength to 4/5 to be able to perform IADLs with decreased pain    Time 6    Period Weeks    Status New    Target Date 12/03/20      PT LONG TERM GOAL #4   Title Pt will improve Rt ankle ROM by 10 degrees and all directions to perform gait with decreased pain and abnormalities    Time 6    Period Weeks    Status New    Target Date 12/03/20      PT LONG TERM GOAL #5   Title Pt will perform gait household distances with normal gait pattern and pain <= 2/10    Time 6    Period Weeks    Status New    Target Date 12/03/20                 Plan - 10/28/20 0925    Clinical Impression Statement Pt with much improved tolerance to wt bearing this session. Gait training with crutches improves with repetition and cuing. Able to tolerate theraband exercises today    PT Next Visit Plan progress strength and ROM, modalities and manual as indicated    PT Home Exercise Plan Access Code: [email or web address]    Consulted and Agree with Plan of Care Patient           Patient will benefit from skilled therapeutic intervention in order to improve the following deficits and impairments:     Visit Diagnosis: Pain in right ankle and joints of right foot  Difficulty in walking, not  elsewhere classified  Muscle weakness (generalized)  Decreased proprioception of joint of right foot     Problem List Patient Active Problem List   Diagnosis Date Noted  . Acute maxillary sinusitis 05/20/2020  . Calculus of gallbladder without cholecystitis without obstruction 12/13/2019  . Oral contraceptive pill surveillance 04/19/2019  . Epiphora, right 04/19/2019  . Deviated nasal septum 04/19/2019  . Hypertension goal BP (blood pressure) < 130/80 03/15/2019  . GAD (generalized anxiety disorder) 02/08/2019  . Encounter for initial prescription of contraceptive pills 02/08/2019  . Elevated blood pressure reading 02/08/2019  . Osteochondral defect of talus 10/01/2018  . Right lumbar radiculitis 10/01/2018  . Phlebolith 09/29/2018  . Breakthrough bleeding associated with intrauterine device (IUD) 11/17/2017   Deaun Rocha, PT  [facility] 10/28/2020, 9:27 AM  [facility] [address] [place], [place], [address] Phone: [phone]   Fax:  [phone]  Name: Rachel Levy MRN: [id number] Date of Birth: 1980/09/15

## 2020-10-31 ENCOUNTER — Other Ambulatory Visit: Payer: Self-pay

## 2020-10-31 ENCOUNTER — Ambulatory Visit (INDEPENDENT_AMBULATORY_CARE_PROVIDER_SITE_OTHER): Payer: 59 | Admitting: Physical Therapy

## 2020-10-31 DIAGNOSIS — R262 Difficulty in walking, not elsewhere classified: Secondary | ICD-10-CM

## 2020-10-31 DIAGNOSIS — M6281 Muscle weakness (generalized): Secondary | ICD-10-CM | POA: Diagnosis not present

## 2020-10-31 DIAGNOSIS — M25871 Other specified joint disorders, right ankle and foot: Secondary | ICD-10-CM | POA: Diagnosis not present

## 2020-10-31 DIAGNOSIS — M25571 Pain in right ankle and joints of right foot: Secondary | ICD-10-CM

## 2020-10-31 NOTE — Patient Instructions (Signed)
Access Code: [email or web address] URL: [email or web address] Date: 10/31/2020 Prepared by: [facility]  Exercises Supine Ankle Circles - 1 x daily - 7 x weekly - 3 sets - 10 reps Side to Side Weight Shift with Counter Support - 1 x daily - 7 x weekly - 3 sets - 10 reps Long Sitting Ankle Eversion with Resistance - 1 x daily - 7 x weekly - 3 sets - 10 reps Long Sitting Ankle Plantar Flexion with Resistance - 1 x daily - 7 x weekly - 3 sets - 10 reps Long Sitting Ankle Inversion with Resistance - 1 x daily - 7 x weekly - 3 sets - 10 reps Long Sitting Ankle Dorsiflexion with Anchored Resistance - 1 x daily - 7 x weekly - 3 sets - 10 reps Standing Gastroc Stretch - 1 x daily - 7 x weekly - 3 sets - 1 reps - 20-30 sec hold Standing Soleus Stretch - 1 x daily - 7 x weekly - 3 sets - 1 reps - 20-30 sec hold

## 2020-10-31 NOTE — Therapy (Signed)
[facility] [place] [address] [place], [place], [address] Phone: [phone]   Fax:  [phone]  Physical Therapy Treatment  Patient Details  Name: Rachel Levy MRN: [id number] Date of Birth: [birth year] Referring Provider (PT): Ovid Curd   Encounter Date: 10/31/2020   PT End of Session - 10/31/20 0928    Visit Number 4    Number of Visits 12    Date for PT Re-Evaluation 12/03/20    PT Start Time 0845    PT Stop Time 0933    PT Time Calculation (min) 48 min    Activity Tolerance Patient tolerated treatment well    Behavior During Therapy [facility] for tasks assessed/performed           Past Medical History:  Diagnosis Date  . Anxiety     Past Surgical History:  Procedure Laterality Date  . no history of surgeries      There were no vitals filed for this visit.   Subjective Assessment - 10/31/20 0849    Subjective Pt states things have been going "well"    Patient Stated Goals improve mobility and ROM    Currently in Pain? No/denies                             [facility] Adult PT Treatment/Exercise - 10/31/20 0001      Ambulation/Gait   Ambulation Distance (Feet) 220 Feet    Assistive device Crutches    Gait Comments partial wt bearing Rt LE with focus on normalizing gait pattern      Vasopneumatic   Number Minutes Vasopneumatic  10 minutes    Vasopnuematic Location  Ankle    Vasopneumatic Pressure Medium    Vasopneumatic Temperature  34      Manual Therapy   Passive ROM Rt ankle PROM focus on PF/DF to tolerance and to reduce mm guarding      Ankle Exercises: Seated   Ankle Circles/Pumps AROM;Right;20 reps    BAPS Sitting;Level 2;10 reps      Ankle Exercises: Supine   T-Band ankle 4 way red TB    Other Supine Ankle Exercises bridge x 20 focus on WBAT      Ankle Exercises: Stretches   Gastroc Stretch 3 reps;20 seconds   standing     Ankle Exercises: Standing   Other Standing  Ankle Exercises standing wt shifts laterally and in semi tandem all 2 x 60 sec                  PT Education - 10/31/20 0927    Education Details updated HEP    Person(s) Educated Patient    Methods Explanation;Demonstration    Comprehension Verbalized understanding;Returned demonstration               PT Long Term Goals - 10/22/20 1141      PT LONG TERM GOAL #1   Title Pt will be independent with HEP    Time 6    Period Weeks    Status New    Target Date 12/03/20      PT LONG TERM GOAL #2   Title Pt will improve FOTO to >= 64 to demo improved functional mobility    Time 6    Period Weeks    Status New    Target Date 12/03/20      PT LONG TERM GOAL #3   Title Pt will improve Rt ankle  strength to 4/5 to be able to perform IADLs with decreased pain    Time 6    Period Weeks    Status New    Target Date 12/03/20      PT LONG TERM GOAL #4   Title Pt will improve Rt ankle ROM by 10 degrees and all directions to perform gait with decreased pain and abnormalities    Time 6    Period Weeks    Status New    Target Date 12/03/20      PT LONG TERM GOAL #5   Title Pt will perform gait household distances with normal gait pattern and pain <= 2/10    Time 6    Period Weeks    Status New    Target Date 12/03/20                 Plan - 10/31/20 2094    Clinical Impression Statement Pt is progressing well with therapy. She has improved wt bearing and was able to add bridges and standing gastroc/soleus stretch today. Pt initially with increased mm guarding into PF/DF but improved with manual PROM    PT Next Visit Plan continue to progress gait training and PWB, manual and modalities as indicated    PT Home Exercise Plan Access Code: [email or web address]    Consulted and Agree with Plan of Care Patient           Patient will benefit from skilled therapeutic intervention in order to improve the following deficits and impairments:     Visit Diagnosis: Pain in right  ankle and joints of right foot  Difficulty in walking, not elsewhere classified  Muscle weakness (generalized)  Decreased proprioception of joint of right foot     Problem List Patient Active Problem List   Diagnosis Date Noted  . Acute maxillary sinusitis 05/20/2020  . Calculus of gallbladder without cholecystitis without obstruction 12/13/2019  . Oral contraceptive pill surveillance 04/19/2019  . Epiphora, right 04/19/2019  . Deviated nasal septum 04/19/2019  . Hypertension goal BP (blood pressure) < 130/80 03/15/2019  . GAD (generalized anxiety disorder) 02/08/2019  . Encounter for initial prescription of contraceptive pills 02/08/2019  . Elevated blood pressure reading 02/08/2019  . Osteochondral defect of talus 10/01/2018  . Right lumbar radiculitis 10/01/2018  . Phlebolith 09/29/2018  . Breakthrough bleeding associated with intrauterine device (IUD) 11/17/2017   Ninamarie Keel, PT  [facility] 10/31/2020, 9:30 AM  [facility] [address] [place], [place], [address] Phone: [phone]   Fax:  [phone]  Name: Rachel Levy MRN: [id number] Date of Birth: 25-Mar-1981

## 2020-11-04 ENCOUNTER — Encounter: Payer: Self-pay | Admitting: Physical Therapy

## 2020-11-04 ENCOUNTER — Other Ambulatory Visit: Payer: Self-pay

## 2020-11-04 ENCOUNTER — Ambulatory Visit (INDEPENDENT_AMBULATORY_CARE_PROVIDER_SITE_OTHER): Payer: 59 | Admitting: Physical Therapy

## 2020-11-04 DIAGNOSIS — M25571 Pain in right ankle and joints of right foot: Secondary | ICD-10-CM | POA: Diagnosis not present

## 2020-11-04 DIAGNOSIS — M6281 Muscle weakness (generalized): Secondary | ICD-10-CM | POA: Diagnosis not present

## 2020-11-04 DIAGNOSIS — M25871 Other specified joint disorders, right ankle and foot: Secondary | ICD-10-CM | POA: Diagnosis not present

## 2020-11-04 DIAGNOSIS — R262 Difficulty in walking, not elsewhere classified: Secondary | ICD-10-CM

## 2020-11-04 NOTE — Therapy (Signed)
[place], [place], [address] Phone: [phone]   Fax:  [phone]  Physical Therapy Treatment  Patient Details  Name: Rachel Levy MRN: [id number] Date of Birth: 05/27/1981 Referring Provider (PT): Celesta Gentile   Encounter Date: 11/04/2020   PT End of Session - 11/04/20 1022    Visit Number 5    Number of Visits 12    Date for PT Re-Evaluation 12/03/20    PT Start Time 1019    PT Stop Time 1101    PT Time Calculation (min) 42 min    Activity Tolerance Patient tolerated treatment well    Behavior During Therapy [facility] for tasks assessed/performed           Past Medical History:  Diagnosis Date  . Anxiety     Past Surgical History:  Procedure Laterality Date  . no history of surgeries      There were no vitals filed for this visit.   Subjective Assessment - 11/04/20 1021    Subjective Pt reports she is anxious to use her (Rt) foot more.  No other new changes since last visit.    How long can you walk comfortably? Just transitioning off of NWB    Patient Stated Goals improve mobility and ROM    Currently in Pain? No/denies    Pain Score 0-No pain              [facility] PT Assessment - 11/04/20 0001      Assessment   Medical Diagnosis Rt OCD talus repair with medial malleolar osteotomy    Referring Provider (PT) Celesta Gentile    Next MD Visit 11/14/20      AROM   Right Ankle Dorsiflexion 0    Right Ankle Plantar Flexion 35    Right Ankle Inversion 25    Right Ankle Eversion 21            [facility] Adult PT Treatment/Exercise - 11/04/20 0001      Ambulation/Gait   Ambulation Distance (Feet) 80 Feet    Assistive device Crutches    Gait Pattern Step-to pattern;Decreased stance time - right    Stairs Yes    Stair Management Technique Step to pattern;Forwards;With crutches    Number of Stairs 8    Height of Stairs --   3 and 6"   Pre-Gait Activities forward/backward  weight shifts with RLE on scale to assess WB - pt bearing ~40% into RLE x 10 reps    Gait Comments PWB.  cues and demo provided for improved form - cues for straight Rt knee and offloading into hands during stance phase.      Vasopneumatic   Number Minutes Vasopneumatic  10 minutes    Vasopnuematic Location  Ankle   Rt   Vasopneumatic Pressure Medium    Vasopneumatic Temperature  34      Ankle Exercises: Seated   Ankle Circles/Pumps AROM;Right;5 reps    Towel Crunch 1 rep   cues to slow down.   Towel Inversion/Eversion 2 reps    Heel Raises Right;15 reps    Toe Raise 10 reps   RLE   BAPS Sitting;Level 2;Level 3;10 reps   PF/Df, inv/ever, CW/CCW     Ankle Exercises: Aerobic   Nustep 5 min, for ROM- range to tolerance with slow holds.                       PT Long Term  Goals - 11/04/20 1058      PT LONG TERM GOAL #1   Title Pt will be independent with HEP    Time 6    Period Weeks    Status On-going      PT LONG TERM GOAL #2   Title Pt will improve FOTO to >= 64 to demo improved functional mobility    Time 6    Period Weeks    Status On-going      PT LONG TERM GOAL #3   Title Pt will improve Rt ankle strength to 4/5 to be able to perform IADLs with decreased pain    Time 6    Period Weeks    Status On-going      PT LONG TERM GOAL #4   Title Pt will improve Rt ankle ROM by 10 degrees and all directions to perform gait with decreased pain and abnormalities    Time 6    Period Weeks    Status Partially Met      PT LONG TERM GOAL #5   Title Pt will perform gait household distances with normal gait pattern and pain <= 2/10    Time 6    Period Weeks    Status On-going                 Plan - 11/04/20 1059    Clinical Impression Statement Good improvement in Rt AROM.  She tolerated all exercises well, without increase in pain. Pt would benefit from additional gait training.  Progressing well towards goals.    PT Frequency 2x / week    PT Duration  6 weeks    PT Treatment/Interventions Iontophoresis 26m/ml Dexamethasone;Cryotherapy;Electrical Stimulation;Gait training;DME Instruction;Moist Heat;Therapeutic activities;Therapeutic exercise;Balance training;Neuromuscular re-education;Vasopneumatic Device;Taping;Manual techniques;Patient/family education;Stair training;Passive range of motion;Dry needling    PT Next Visit Plan continue to progress gait training and PWB, manual and modalities as indicated    PT Home Exercise Plan Access Code: [email or web address]   Consulted and Agree with Plan of Care Patient           Patient will benefit from skilled therapeutic intervention in order to improve the following deficits and impairments:  Abnormal gait,Difficulty walking,Pain,Decreased activity tolerance,Decreased strength,Increased edema,Decreased range of motion  Visit Diagnosis: Pain in right ankle and joints of right foot  Difficulty in walking, not elsewhere classified  Muscle weakness (generalized)  Decreased proprioception of joint of right foot     Problem List Patient Active Problem List   Diagnosis Date Noted  . Acute maxillary sinusitis 05/20/2020  . Calculus of gallbladder without cholecystitis without obstruction 12/13/2019  . Oral contraceptive pill surveillance 04/19/2019  . Epiphora, right 04/19/2019  . Deviated nasal septum 04/19/2019  . Hypertension goal BP (blood pressure) < 130/80 03/15/2019  . GAD (generalized anxiety disorder) 02/08/2019  . Encounter for initial prescription of contraceptive pills 02/08/2019  . Elevated blood pressure reading 02/08/2019  . Osteochondral defect of talus 10/01/2018  . Right lumbar radiculitis 10/01/2018  . Phlebolith 09/29/2018  . Breakthrough bleeding associated with intrauterine device (IUD) 11/17/2017   JKerin Perna PTA 11/04/20 11:02 AM  [place] [address]Phone:  [phone]  Fax:  [phone] Name: Rachel NEAULTMRN: [id number] of Birth: [birth year]

## 2020-11-06 ENCOUNTER — Encounter: Payer: Self-pay | Admitting: Physical Therapy

## 2020-11-06 ENCOUNTER — Ambulatory Visit (INDEPENDENT_AMBULATORY_CARE_PROVIDER_SITE_OTHER): Payer: 59 | Admitting: Physical Therapy

## 2020-11-06 ENCOUNTER — Other Ambulatory Visit: Payer: Self-pay

## 2020-11-06 DIAGNOSIS — R262 Difficulty in walking, not elsewhere classified: Secondary | ICD-10-CM | POA: Diagnosis not present

## 2020-11-06 DIAGNOSIS — M25571 Pain in right ankle and joints of right foot: Secondary | ICD-10-CM

## 2020-11-06 DIAGNOSIS — M25871 Other specified joint disorders, right ankle and foot: Secondary | ICD-10-CM

## 2020-11-06 DIAGNOSIS — M6281 Muscle weakness (generalized): Secondary | ICD-10-CM

## 2020-11-06 NOTE — Therapy (Signed)
[place], [place], [address] Phone: [phone]   Fax:  [phone]  Physical Therapy Treatment  Patient Details  Name: Rachel Levy MRN: [id number] Date of Birth: November 26, 1980 Referring Provider (PT): Celesta Gentile   Encounter Date: 11/06/2020   PT End of Session - 11/06/20 1030    Visit Number 6    Number of Visits 12    Date for PT Re-Evaluation 12/03/20    PT Start Time 5465    PT Stop Time 1056    PT Time Calculation (min) 41 min    Activity Tolerance Patient tolerated treatment well    Behavior During Therapy [facility] for tasks assessed/performed           Past Medical History:  Diagnosis Date  . Anxiety     Past Surgical History:  Procedure Laterality Date  . no history of surgeries      There were no vitals filed for this visit.   Subjective Assessment - 11/06/20 1030    Subjective Pt reports she has been going up stairs with crutches now, instead of crawling.  She is noticing better range in her Rt ankle.    Patient Stated Goals improve mobility and ROM    Currently in Pain? No/denies              [facility] PT Assessment - 11/06/20 0001      Assessment   Medical Diagnosis Rt OCD talus repair with medial malleolar osteotomy    Referring Provider (PT) Celesta Gentile    Next MD Visit 11/14/20            [facility] Adult PT Treatment/Exercise - 11/06/20 0001      Knee/Hip Exercises: Supine   Straight Leg Raises Strengthening;Right;1 set;10 reps      Knee/Hip Exercises: Sidelying   Hip ABduction Strengthening;Right;1 set;10 reps    Hip ADduction Strengthening;Right;1 set;10 reps      Knee/Hip Exercises: Prone   Hip Extension Strengthening;Right;1 set;10 reps      Vasopneumatic   Number Minutes Vasopneumatic  10 minutes    Vasopnuematic Location  Ankle   Rt   Vasopneumatic Pressure Low    Vasopneumatic Temperature  34      Ankle Exercises: Aerobic   Nustep 5 min,  for ROM- range to tolerance with slow holds.      Ankle Exercises: Seated   Heel Raises Right;10 reps;3 seconds    Other Seated Ankle Exercises Rt ankle PF with green x 10, blue x 10, then DF, inversion, eversion with green x 10      Ankle Exercises: Stretches   Soleus Stretch 5 reps;10 seconds   seated, per HEP   Gastroc Stretch 1 rep;60 seconds   long sitting with strap   Other Stretch Rt ant tib stretch x 15 sec x 2, per HEP      Ankle Exercises: Standing   Other Standing Ankle Exercises sit to/from stand x 3 reps (boot on, but open) and Lt foot on blue pad                  PT Education - 11/06/20 1052    Education Details updated HEP - issued green band for ankle exercise advancement.    Person(s) Educated Patient    Methods Explanation;Demonstration;Handout    Comprehension Verbalized understanding;Returned demonstration               PT Long Term Goals - 11/04/20 1058  PT LONG TERM GOAL #1   Title Pt will be independent with HEP    Time 6    Period Weeks    Status On-going      PT LONG TERM GOAL #2   Title Pt will improve FOTO to >= 64 to demo improved functional mobility    Time 6    Period Weeks    Status On-going      PT LONG TERM GOAL #3   Title Pt will improve Rt ankle strength to 4/5 to be able to perform IADLs with decreased pain    Time 6    Period Weeks    Status On-going      PT LONG TERM GOAL #4   Title Pt will improve Rt ankle ROM by 10 degrees and all directions to perform gait with decreased pain and abnormalities    Time 6    Period Weeks    Status Partially Met      PT LONG TERM GOAL #5   Title Pt will perform gait household distances with normal gait pattern and pain <= 2/10    Time 6    Period Weeks    Status On-going                 Plan - 11/06/20 1041    Clinical Impression Statement Introduced pt to hip strengthening exercises; tolerated well and added to HEP.  Pt tolerated all other exercises well.   Progressing well towards goals.    Rehab Potential Good    PT Frequency 2x / week    PT Duration 6 weeks    PT Treatment/Interventions Iontophoresis 90m/ml Dexamethasone;Cryotherapy;Electrical Stimulation;Gait training;DME Instruction;Moist Heat;Therapeutic activities;Therapeutic exercise;Balance training;Neuromuscular re-education;Vasopneumatic Device;Taping;Manual techniques;Patient/family education;Stair training;Passive range of motion;Dry needling    PT Next Visit Plan continue to progress gait training and PWB, manual and modalities as indicated    PT Home Exercise Plan Access Code: [email or web address]   Consulted and Agree with Plan of Care Patient           Patient will benefit from skilled therapeutic intervention in order to improve the following deficits and impairments:  Abnormal gait,Difficulty walking,Pain,Decreased activity tolerance,Decreased strength,Increased edema,Decreased range of motion  Visit Diagnosis: Pain in right ankle and joints of right foot  Difficulty in walking, not elsewhere classified  Muscle weakness (generalized)  Decreased proprioception of joint of right foot     Problem List Patient Active Problem List   Diagnosis Date Noted  . Acute maxillary sinusitis 05/20/2020  . Calculus of gallbladder without cholecystitis without obstruction 12/13/2019  . Oral contraceptive pill surveillance 04/19/2019  . Epiphora, right 04/19/2019  . Deviated nasal septum 04/19/2019  . Hypertension goal BP (blood pressure) < 130/80 03/15/2019  . GAD (generalized anxiety disorder) 02/08/2019  . Encounter for initial prescription of contraceptive pills 02/08/2019  . Elevated blood pressure reading 02/08/2019  . Osteochondral defect of talus 10/01/2018  . Right lumbar radiculitis 10/01/2018  . Phlebolith 09/29/2018  . Breakthrough bleeding associated with intrauterine device (IUD) 11/17/2017   JKerin Perna PTA 11/06/20 10:56 AM  [facility] [address]Phone: [phone]  Fax:  [phone] Name: GMAKANA FEIGELMRN: [id number] of Birth: [birth year]

## 2020-11-06 NOTE — Patient Instructions (Signed)
Access Code: [email or web address] URL: [email or web address] Date: 11/06/2020 Prepared by: [facility]   Exercises Supine Ankle Circles - 1 x daily - 7 x weekly - 3 sets - 10 reps Side to Side Weight Shift with Counter Support - 1 x daily - 7 x weekly - 3 sets - 10 reps Long Sitting Ankle Eversion with Resistance - 1 x daily - 7 x weekly - 3 sets - 10 reps Long Sitting Ankle Plantar Flexion with Resistance - 1 x daily - 7 x weekly - 3 sets - 10 reps Long Sitting Ankle Inversion with Resistance - 1 x daily - 7 x weekly - 3 sets - 10 reps Long Sitting Ankle Dorsiflexion with Anchored Resistance - 1 x daily - 7 x weekly - 3 sets - 10 reps Standing Gastroc Stretch - 1 x daily - 7 x weekly - 3 sets - 1 reps - 20-30 sec hold Standing Soleus Stretch - 1 x daily - 7 x weekly - 3 sets - 1 reps - 20-30 sec hold Supine Active Straight Leg Raise - 1 x daily - 3 x weekly - 2 sets - 10 reps Sidelying Hip Abduction - 1 x daily - 3 x weekly - 2 sets - 10 reps Prone Hip Extension - One Pillow - 1 x daily - 3 x weekly - 2 sets - 10 reps Sidelying Hip Adduction - 1 x daily - 3 x weekly - 2 sets - 10 reps Seated Dorsiflexion Stretch - 1 x daily - 7 x weekly - 1 sets - 5 reps - 10 hold Seated Anterior Tibialis Stretch - 1 x daily - 7 x weekly - 1 sets - 5 reps - 10 hold

## 2020-11-11 ENCOUNTER — Ambulatory Visit (INDEPENDENT_AMBULATORY_CARE_PROVIDER_SITE_OTHER): Payer: 59 | Admitting: Physical Therapy

## 2020-11-11 ENCOUNTER — Other Ambulatory Visit: Payer: Self-pay

## 2020-11-11 DIAGNOSIS — R262 Difficulty in walking, not elsewhere classified: Secondary | ICD-10-CM | POA: Diagnosis not present

## 2020-11-11 DIAGNOSIS — M25871 Other specified joint disorders, right ankle and foot: Secondary | ICD-10-CM

## 2020-11-11 DIAGNOSIS — M25571 Pain in right ankle and joints of right foot: Secondary | ICD-10-CM | POA: Diagnosis not present

## 2020-11-11 DIAGNOSIS — M6281 Muscle weakness (generalized): Secondary | ICD-10-CM

## 2020-11-11 NOTE — Therapy (Signed)
[place], [place], [address] Phone: [phone]   Fax:  [phone]  Physical Therapy Treatment  Patient Details  Name: SAIDY ORMAND MRN: [id number] Date of Birth: 1981/05/20 Referring Provider (PT): Celesta Gentile   Encounter Date: 11/11/2020   PT End of Session - 11/11/20 1053    Visit Number 7    Number of Visits 12    Date for PT Re-Evaluation 12/03/20    PT Start Time 1013    PT Stop Time 1103    PT Time Calculation (min) 50 min    Activity Tolerance Patient tolerated treatment well    Behavior During Therapy [facility] for tasks assessed/performed           Past Medical History:  Diagnosis Date  . Anxiety     Past Surgical History:  Procedure Laterality Date  . no history of surgeries      There were no vitals filed for this visit.   Subjective Assessment - 11/11/20 1015    Subjective Pt states her ankle feels "less delicate" than it has been. She is noticing improved ROM    Currently in Pain? No/denies                             [facility] Adult PT Treatment/Exercise - 11/11/20 0001      Ambulation/Gait   Ambulation Distance (Feet) 180 Feet    Assistive device Crutches    Gait Pattern Step-through pattern    Stair Management Technique With crutches;Step to pattern    Number of Stairs 8    Gait Comments PWB, cues for step through pattern      Knee/Hip Exercises: Supine   Straight Leg Raises Strengthening;Right;2 sets;10 reps      Knee/Hip Exercises: Sidelying   Hip ABduction Strengthening;2 sets;10 reps    Hip ADduction Strengthening;Right;2 sets;10 reps      Knee/Hip Exercises: Prone   Hip Extension Strengthening;2 sets;10 reps      Vasopneumatic   Number Minutes Vasopneumatic  10 minutes    Vasopnuematic Location  Ankle    Vasopneumatic Pressure Low    Vasopneumatic Temperature  34      Ankle Exercises: Aerobic   Nustep 5 min for ROM to tolerance       Ankle Exercises: Stretches   Soleus Stretch 1 rep;60 seconds   standing at stairs   Gastroc Stretch 1 rep;60 seconds   standing at stairs     Ankle Exercises: Seated   Heel Raises Right;15 reps    BAPS Sitting;Level 3;10 reps   PF/DF/CW/CCW   Other Seated Ankle Exercises Rt ankle PF/DF/inv/ev all 2 x 10 with green TB      Ankle Exercises: Standing   Other Standing Ankle Exercises STS x 10, then x 10 with Lt LE on blue foam                       PT Long Term Goals - 11/04/20 1058      PT LONG TERM GOAL #1   Title Pt will be independent with HEP    Time 6    Period Weeks    Status On-going      PT LONG TERM GOAL #2   Title Pt will improve FOTO to >= 64 to demo improved functional mobility    Time 6    Period Weeks    Status On-going  PT LONG TERM GOAL #3   Title Pt will improve Rt ankle strength to 4/5 to be able to perform IADLs with decreased pain    Time 6    Period Weeks    Status On-going      PT LONG TERM GOAL #4   Title Pt will improve Rt ankle ROM by 10 degrees and all directions to perform gait with decreased pain and abnormalities    Time 6    Period Weeks    Status Partially Met      PT LONG TERM GOAL #5   Title Pt will perform gait household distances with normal gait pattern and pain <= 2/10    Time 6    Period Weeks    Status On-going                 Plan - 11/11/20 1053    Clinical Impression Statement Pt with good tolerance to hip exercises and standing gastroc/soleus stretch. Still with decreased PF ROM.  Improving tolerance to gait and stairs with PWB and crutches    PT Next Visit Plan send note to MD, continue to progress strength    PT Home Exercise Plan Access Code: [email or web address]    Consulted and Agree with Plan of Care Patient           Patient will benefit from skilled therapeutic intervention in order to improve the following deficits and impairments:     Visit Diagnosis: Pain in right ankle and joints of  right foot  Difficulty in walking, not elsewhere classified  Muscle weakness (generalized)  Decreased proprioception of joint of right foot     Problem List Patient Active Problem List   Diagnosis Date Noted  . Acute maxillary sinusitis 05/20/2020  . Calculus of gallbladder without cholecystitis without obstruction 12/13/2019  . Oral contraceptive pill surveillance 04/19/2019  . Epiphora, right 04/19/2019  . Deviated nasal septum 04/19/2019  . Hypertension goal BP (blood pressure) < 130/80 03/15/2019  . GAD (generalized anxiety disorder) 02/08/2019  . Encounter for initial prescription of contraceptive pills 02/08/2019  . Elevated blood pressure reading 02/08/2019  . Osteochondral defect of talus 10/01/2018  . Right lumbar radiculitis 10/01/2018  . Phlebolith 09/29/2018  . Breakthrough bleeding associated with intrauterine device (IUD) 11/17/2017   Havoc Sanluis, PT  [place] Greb 11/11/2020, 10:55 AM  [facility] [place], [place], [address] Phone: [phone]   Fax:  [phone]  Name: RIANNA LUKES MRN: [id number] Date of Birth: 08-07-1981

## 2020-11-13 ENCOUNTER — Encounter: Payer: Self-pay | Admitting: Physical Therapy

## 2020-11-13 ENCOUNTER — Other Ambulatory Visit: Payer: Self-pay

## 2020-11-13 ENCOUNTER — Ambulatory Visit (INDEPENDENT_AMBULATORY_CARE_PROVIDER_SITE_OTHER): Payer: 59 | Admitting: Physical Therapy

## 2020-11-13 DIAGNOSIS — R262 Difficulty in walking, not elsewhere classified: Secondary | ICD-10-CM | POA: Diagnosis not present

## 2020-11-13 DIAGNOSIS — M25571 Pain in right ankle and joints of right foot: Secondary | ICD-10-CM | POA: Diagnosis not present

## 2020-11-13 DIAGNOSIS — M25871 Other specified joint disorders, right ankle and foot: Secondary | ICD-10-CM

## 2020-11-13 DIAGNOSIS — M6281 Muscle weakness (generalized): Secondary | ICD-10-CM

## 2020-11-13 NOTE — Therapy (Signed)
[facility] [address] [place], [place], [address] Phone: [phone]   Fax:  [phone]  Physical Therapy Treatment  Patient Details  Name: Rachel Levy MRN: [id number] Date of Birth: 01/06/1981 Referring Provider (PT): Wagoner, Matthew   Encounter Date: 11/13/2020   PT End of Session - 11/13/20 0931    Visit Number 8    Number of Visits 12    Date for PT Re-Evaluation 12/03/20    PT Start Time 0930    PT Stop Time 1015    PT Time Calculation (min) 45 min    Activity Tolerance Patient tolerated treatment well    Behavior During Therapy [facility] for tasks assessed/performed           Past Medical History:  Diagnosis Date  . Anxiety     Past Surgical History:  Procedure Laterality Date  . no history of surgeries      There were no vitals filed for this visit.   Subjective Assessment - 11/13/20 0931    Subjective "It's feeling very, very good".  Pt reports is much more comfortable standing with even weight between feet and getting up from sitting is much easier    Currently in Pain? No/denies    Pain Score 0-No pain              [facility] PT Assessment - 11/13/20 0001      Assessment   Medical Diagnosis Rt OCD talus repair with medial malleolar osteotomy    Referring Provider (PT) Wagoner, Matthew    Next MD Visit 11/14/20      Precautions   Required Braces or Orthoses Other Brace/Splint    Other Brace/Splint Rt surgical boot      Observation/Other Assessments   Focus on Therapeutic Outcomes (FOTO)  49 Functional status      ROM / Strength   AROM / PROM / Strength PROM;AROM      AROM   Right Ankle Dorsiflexion 0    Right Ankle Plantar Flexion 40    Right Ankle Inversion 32    Right Ankle Eversion 28      PROM   PROM Assessment Site Ankle    Right/Left Ankle Right    Right Ankle Dorsiflexion 4      Strength   Right Ankle Dorsiflexion 4+/5    Right Ankle Inversion 3+/5    Right Ankle  Eversion 4/5            [facility] Adult PT Treatment/Exercise - 11/13/20 0001      Knee/Hip Exercises: Stretches   Passive Hamstring Stretch Right;2 reps;30 seconds   hooklying with strap   Quad Stretch Right;2 reps;30 seconds   prone with strap   Quad Stretch Limitations 1 rep on Lt for comparison      Knee/Hip Exercises: Supine   Single Leg Bridge Left;1 set;5 reps;Right;10 reps    Straight Leg Raises Strengthening;Right;1 set;10 reps   long sitting   Straight Leg Raise with External Rotation Limitations trial of 3 reps in long sitting      Knee/Hip Exercises: Prone   Hip Extension Strengthening;Right;2 sets   1 set knee straight, 1 set knee bent     Vasopneumatic   Number Minutes Vasopneumatic  10 minutes    Vasopnuematic Location  Ankle   Rt   Vasopneumatic Pressure Low    Vasopneumatic Temperature  34      Ankle Exercises: Seated   Heel Raises Right;15 reps    Toe   Raise 15 reps;2 seconds    BAPS Sitting;10 reps;Level 4   PF/DF/CW/CCW   Heel Slides Right;10 reps      Ankle Exercises: Aerobic   Nustep 5 min for ROM to tolerance      Ankle Exercises: Standing   Other Standing Ankle Exercises sit to/from stand x 6 reps (boot on, but open)          Resisted PF (RLE) with blue band x 10 (simulating gas pedal).   PROM into PF x 3 reps of 30 sec.       PT Education - 11/13/20 1016    Education Details updated HEP    Person(s) Educated Patient    Methods Explanation;Demonstration;Handout    Comprehension Verbalized understanding;Returned demonstration               PT Long Term Goals - 11/04/20 1058      PT LONG TERM GOAL #1   Title Pt will be independent with HEP    Time 6    Period Weeks    Status On-going      PT LONG TERM GOAL #2   Title Pt will improve FOTO to >= 64 to demo improved functional mobility    Time 6    Period Weeks    Status On-going      PT LONG TERM GOAL #3   Title Pt will improve Rt ankle strength to 4/5 to be able to perform  IADLs with decreased pain    Time 6    Period Weeks    Status On-going      PT LONG TERM GOAL #4   Title Pt will improve Rt ankle ROM by 10 degrees and all directions to perform gait with decreased pain and abnormalities    Time 6    Period Weeks    Status Partially Met      PT LONG TERM GOAL #5   Title Pt will perform gait household distances with normal gait pattern and pain <= 2/10    Time 6    Period Weeks    Status On-going                 Plan - 11/13/20 1009    Clinical Impression Statement Good improvement in Rt ankle AROM and strength.  She is tolerating resisted exercises well and partial weight bearing in standing.  Pt making good progress towards LTGs.    Rehab Potential Good    PT Frequency 2x / week    PT Duration 6 weeks    PT Treatment/Interventions Iontophoresis 53m/ml Dexamethasone;Cryotherapy;Electrical Stimulation;Gait training;DME Instruction;Moist Heat;Therapeutic activities;Therapeutic exercise;Balance training;Neuromuscular re-education;Vasopneumatic Device;Taping;Manual techniques;Patient/family education;Stair training;Passive range of motion;Dry needling    PT Next Visit Plan continue progress Rt ankle strength and ROM.    PT Home Exercise Plan Access Code: [email or web address]          Patient will benefit from skilled therapeutic intervention in order to improve the following deficits and impairments:  Abnormal gait,Difficulty walking,Pain,Decreased activity tolerance,Decreased strength,Increased edema,Decreased range of motion  Visit Diagnosis: Pain in right ankle and joints of right foot  Difficulty in walking, not elsewhere classified  Muscle weakness (generalized)  Decreased proprioception of joint of right foot     Problem List Patient Active Problem List   Diagnosis Date Noted  . Acute maxillary sinusitis 05/20/2020  . Calculus of gallbladder without cholecystitis without obstruction 12/13/2019  . Oral contraceptive pill surveillance  04/19/2019  . Epiphora, right 04/19/2019  . Deviated nasal septum  04/19/2019  . Hypertension goal BP (blood pressure) < 130/80 03/15/2019  . GAD (generalized anxiety disorder) 02/08/2019  . Encounter for initial prescription of contraceptive pills 02/08/2019  . Elevated blood pressure reading 02/08/2019  . Osteochondral defect of talus 10/01/2018  . Right lumbar radiculitis 10/01/2018  . Phlebolith 09/29/2018  . Breakthrough bleeding associated with intrauterine device (IUD) 11/17/2017    Carlson-Long, PTA 11/13/20 10:19 AM  [facility] [address] [place], [place], [address] Phone: [phone]   Fax:  [phone]  Name: Brittani M Mumford MRN: [id number] Date of Birth: 05/07/1981   

## 2020-11-13 NOTE — Patient Instructions (Signed)
Access Code: [email or web address] URL: [email or web address] Date: 11/13/2020 Prepared by: [facility] [place]  Exercises Supine Ankle Circles - 1 x daily - 7 x weekly - 3 sets - 10 reps Side to Side Weight Shift with Counter Support - 1 x daily - 7 x weekly - 3 sets - 10 reps Long Sitting Ankle Eversion with Resistance - 1 x daily - 7 x weekly - 3 sets - 10 reps Long Sitting Ankle Plantar Flexion with Resistance - 1 x daily - 7 x weekly - 3 sets - 10 reps Long Sitting Ankle Inversion with Resistance - 1 x daily - 7 x weekly - 3 sets - 10 reps Long Sitting Ankle Dorsiflexion with Anchored Resistance - 1 x daily - 7 x weekly - 3 sets - 10 reps Standing Gastroc Stretch - 1 x daily - 7 x weekly - 3 sets - 1 reps - 20-30 sec hold Standing Soleus Stretch - 1 x daily - 7 x weekly - 3 sets - 1 reps - 20-30 sec hold Supine Active Straight Leg Raise - 1 x daily - 3 x weekly - 2 sets - 10 reps Sidelying Hip Abduction - 1 x daily - 3 x weekly - 2 sets - 10 reps Sidelying Hip Adduction - 1 x daily - 3 x weekly - 2 sets - 10 reps Seated Dorsiflexion Stretch - 1 x daily - 7 x weekly - 1 sets - 5 reps - 10 hold Seated Anterior Tibialis Stretch - 1 x daily - 7 x weekly - 1 sets - 5 reps - 10 hold Figure 4 Bridge - 1 x daily - 3 x weekly - 1-2 sets - 10 reps Prone Hip Extension with Bent Knee - 1 x daily - 3 x weekly - 1 sets - 10 reps Prone Quadriceps Stretch with Strap - 1 x daily - 7 x weekly - 3 sets - 3 reps - 30 sec hold Hooklying Hamstring Stretch with Strap - 1 x daily - 7 x weekly - 3 sets - 3 reps - 30 sec hold

## 2020-11-14 ENCOUNTER — Ambulatory Visit (INDEPENDENT_AMBULATORY_CARE_PROVIDER_SITE_OTHER): Payer: 59 | Admitting: Podiatry

## 2020-11-14 ENCOUNTER — Ambulatory Visit (INDEPENDENT_AMBULATORY_CARE_PROVIDER_SITE_OTHER): Payer: 59

## 2020-11-14 DIAGNOSIS — M19071 Primary osteoarthritis, right ankle and foot: Secondary | ICD-10-CM | POA: Diagnosis not present

## 2020-11-14 DIAGNOSIS — M898X7 Other specified disorders of bone, ankle and foot: Secondary | ICD-10-CM | POA: Diagnosis not present

## 2020-11-14 DIAGNOSIS — M958 Other specified acquired deformities of musculoskeletal system: Secondary | ICD-10-CM

## 2020-11-14 DIAGNOSIS — M93271 Osteochondritis dissecans, right ankle and joints of right foot: Secondary | ICD-10-CM | POA: Diagnosis not present

## 2020-11-14 NOTE — Progress Notes (Signed)
Subjective: Rachel Levy is a 40 y.o. is seen today in office s/p RIGHT ankle medial malleolar osteotomy with osteochondral repair preformed on 08/13/2020. She reports that she is doing "so much better".  She is also been going to physical therapy which she states has been very helpful.  She has been putting weight on it in the boot and physical therapy.  She presents today using a knee scooter. No recent injury or falls or changes.  She has no other concerns today. Denies any fevers, chills, nausea, vomiting.  No calf pain, chest pain, shortness of breath.   Objective: General: No acute distress, AAOx3  DP/PT pulses palpable 2/4, CRT < 3 sec to all digits.  Protective sensation intact. Motor function intact.  RIGHT foot: Incisions are healing well scars are well formed.  Mild edema.  There is no erythema or warmth.  Mild tenderness palpation at surgical site.  Ankle joint range of motion Catherine crepitation restrictions.  No significant pain on the plantar fascial.  No area pinpoint tenderness.  Flexor, extensor tendons appear to be intact. No other open lesions or pre-ulcerative lesions.  No pain with calf compression, swelling, warmth, erythema.   Assessment and Plan:  Status post RIGHT ankle surgery  -Treatment options discussed including all alternatives, risks, and complications -Repeat x-rays. -X-rays ordered.  I sent her a my chart message afterwards which revealed that the osteotomy is healed. -We can proceed at the treatment plan as we discussed today.  She can transition partially.  In the cam boot and then as she is continue to progress she can weight-bear in the cam boot.  When she can weight-bear completely in the cam boot she can then slowly transition to a regular shoe as tolerated. -Discussed that she can take the boot off to drive when she is able to walk in the boot.  At that point she can also return to work.  Vivi Barrack DPM

## 2020-11-18 ENCOUNTER — Ambulatory Visit (INDEPENDENT_AMBULATORY_CARE_PROVIDER_SITE_OTHER): Payer: 59 | Admitting: Physical Therapy

## 2020-11-18 ENCOUNTER — Other Ambulatory Visit: Payer: Self-pay

## 2020-11-18 DIAGNOSIS — M6281 Muscle weakness (generalized): Secondary | ICD-10-CM | POA: Diagnosis not present

## 2020-11-18 DIAGNOSIS — R262 Difficulty in walking, not elsewhere classified: Secondary | ICD-10-CM

## 2020-11-18 DIAGNOSIS — M25571 Pain in right ankle and joints of right foot: Secondary | ICD-10-CM | POA: Diagnosis not present

## 2020-11-18 DIAGNOSIS — M25871 Other specified joint disorders, right ankle and foot: Secondary | ICD-10-CM | POA: Diagnosis not present

## 2020-11-18 NOTE — Therapy (Signed)
[place], [place], [address] Phone: [phone]   Fax:  [phone]  Physical Therapy Treatment  Patient Details  Name: Rachel Levy MRN: [id number] Date of Birth: [birth year] Referring Provider (PT): Celesta Gentile   Encounter Date: 11/18/2020   PT End of Session - 11/18/20 1102    Visit Number 9    Number of Visits 12    Date for PT Re-Evaluation 12/03/20    PT Start Time 6256    PT Stop Time 1100    PT Time Calculation (min) 45 min    Activity Tolerance Patient tolerated treatment well    Behavior During Therapy [facility] for tasks assessed/performed           Past Medical History:  Diagnosis Date  . Anxiety     Past Surgical History:  Procedure Laterality Date  . no history of surgeries      There were no vitals filed for this visit.   Subjective Assessment - 11/18/20 1027    Subjective Pt states MD recommends progressing to full wt bearing in boot, then transitioning to shoe. Pt states she is still not able to work or drive    Patient Stated Goals improve mobility and ROM    Currently in Pain? No/denies                             [facility] Adult PT Treatment/Exercise - 11/18/20 0001      Ambulation/Gait   Ambulation Distance (Feet) 120 Feet    Assistive device Straight cane   cues for technique with SPC   Gait Pattern Step-through pattern    Stair Management Technique With cane    Number of Stairs 16   step to pattern   Gait Comments progressing to full wt bearing in boot      Exercises   Exercises Knee/Hip      Knee/Hip Exercises: Standing   Other Standing Knee Exercises standing tap ups to foam pad with Rt LE wt bearing , Lt LE tapping up 2 x 10      Manual Therapy   Passive ROM Rt ankle all directions to tolerance      Ankle Exercises: Aerobic   Nustep 5 min for ROM and warm up      Ankle Exercises: Seated   BAPS Sitting;Level 3;10 reps    PF/DF/CCW/CW     Ankle Exercises: Standing   Rocker Board 2 minutes   A/P and laterally     Ankle Exercises: Stretches   Soleus Stretch 1 rep;60 seconds   standing   Gastroc Stretch 1 rep;60 seconds   standing                      PT Long Term Goals - 11/04/20 1058      PT LONG TERM GOAL #1   Title Pt will be independent with HEP    Time 6    Period Weeks    Status On-going      PT LONG TERM GOAL #2   Title Pt will improve FOTO to >= 64 to demo improved functional mobility    Time 6    Period Weeks    Status On-going      PT LONG TERM GOAL #3   Title Pt will improve Rt ankle strength to 4/5 to be able to perform IADLs with decreased pain    Time  6    Period Weeks    Status On-going      PT LONG TERM GOAL #4   Title Pt will improve Rt ankle ROM by 10 degrees and all directions to perform gait with decreased pain and abnormalities    Time 6    Period Weeks    Status Partially Met      PT LONG TERM GOAL #5   Title Pt will perform gait household distances with normal gait pattern and pain <= 2/10    Time 6    Period Weeks    Status On-going                 Plan - 11/18/20 1104    Clinical Impression Statement Pt responded well to education on proper technique for gait with SPC. Pt with improving gait speed and improving step and stride length.  Pt continues with decreased PF range in Rt ankle but responds well to manual and self stretching. pt able to tolerate putting full wt in Rt LE (in boot) to perform tap ups this session    PT Next Visit Plan continue to progress full wt bearing during gait and mobility.  progress ankle strength and ROM    PT Home Exercise Plan Access Code: [email or web address]    Consulted and Agree with Plan of Care Patient           Patient will benefit from skilled therapeutic intervention in order to improve the following deficits and impairments:     Visit Diagnosis: Pain in right ankle and joints of right foot  Difficulty in  walking, not elsewhere classified  Muscle weakness (generalized)  Decreased proprioception of joint of right foot     Problem List Patient Active Problem List   Diagnosis Date Noted  . Acute maxillary sinusitis 05/20/2020  . Calculus of gallbladder without cholecystitis without obstruction 12/13/2019  . Oral contraceptive pill surveillance 04/19/2019  . Epiphora, right 04/19/2019  . Deviated nasal septum 04/19/2019  . Hypertension goal BP (blood pressure) < 130/80 03/15/2019  . GAD (generalized anxiety disorder) 02/08/2019  . Encounter for initial prescription of contraceptive pills 02/08/2019  . Elevated blood pressure reading 02/08/2019  . Osteochondral defect of talus 10/01/2018  . Right lumbar radiculitis 10/01/2018  . Phlebolith 09/29/2018  . Breakthrough bleeding associated with intrauterine device (IUD) 11/17/2017   Anecia Nusbaum, PT  Jontez Redfield 11/18/2020, 11:06 AM  [facility] [place], [place], [address] Phone: [phone]   Fax:  [phone]  Name: Rachel Levy MRN: [id number] Date of Birth: 1980-12-04

## 2020-11-19 ENCOUNTER — Encounter: Payer: Self-pay | Admitting: Podiatry

## 2020-11-20 ENCOUNTER — Encounter: Payer: Self-pay | Admitting: Physical Therapy

## 2020-11-20 ENCOUNTER — Ambulatory Visit (INDEPENDENT_AMBULATORY_CARE_PROVIDER_SITE_OTHER): Payer: 59 | Admitting: Physical Therapy

## 2020-11-20 ENCOUNTER — Other Ambulatory Visit: Payer: Self-pay

## 2020-11-20 DIAGNOSIS — M6281 Muscle weakness (generalized): Secondary | ICD-10-CM

## 2020-11-20 DIAGNOSIS — M25571 Pain in right ankle and joints of right foot: Secondary | ICD-10-CM | POA: Diagnosis not present

## 2020-11-20 DIAGNOSIS — M25871 Other specified joint disorders, right ankle and foot: Secondary | ICD-10-CM | POA: Diagnosis not present

## 2020-11-20 DIAGNOSIS — R262 Difficulty in walking, not elsewhere classified: Secondary | ICD-10-CM | POA: Diagnosis not present

## 2020-11-20 NOTE — Patient Instructions (Signed)

## 2020-11-20 NOTE — Therapy (Signed)
[place], [place], [address] Phone: [phone]   Fax:  [phone]  Physical Therapy Treatment  Patient Details  Name: Rachel Levy MRN: [id number] Date of Birth: 1981-08-06 Referring Provider (PT): Celesta Gentile   Encounter Date: 11/20/2020   PT End of Session - 11/20/20 1102    Visit Number 10    Number of Visits 12    Date for PT Re-Evaluation 12/03/20    PT Start Time 1018    PT Stop Time 1058    PT Time Calculation (min) 40 min    Activity Tolerance Patient tolerated treatment well    Behavior During Therapy [facility] for tasks assessed/performed           Past Medical History:  Diagnosis Date  . Anxiety     Past Surgical History:  Procedure Laterality Date  . no history of surgeries      There were no vitals filed for this visit.   Subjective Assessment - 11/20/20 1022    Subjective Pt reports she has been walking without cane/crutch for 2 days (with boot on).  She reports she has some discomfort first thing in morning (2/10), but it resolves once she is up and moving.    Patient Stated Goals improve mobility and ROM    Currently in Pain? No/denies    Pain Score 0-No pain              [facility] PT Assessment - 11/20/20 0001      Assessment   Medical Diagnosis Rt OCD talus repair with medial malleolar osteotomy    Referring Provider (PT) Celesta Gentile    Next MD Visit 01/02/21      AROM   Right Ankle Plantar Flexion 42      PROM   Right Ankle Dorsiflexion 8            [facility] Adult PT Treatment/Exercise - 11/20/20 0001      Lumbar Exercises: Quadruped   Other Quadruped Lumbar Exercises quadruped with toes pointed down, toes tucked under for ROM - with weight shifts back/forth.      Knee/Hip Exercises: Aerobic   Recumbent Bike slow full revolutions to L1 x 5 min total for LE ROM.      Knee/Hip Exercises: Standing   SLS Rt SLS (with boot on) with Lt toe taps to  3" step x 10, without UE support, cues for control of LLE to surface.      Knee/Hip Exercises: Seated   Sit to Sand 10 reps;without UE support   boot and shoe off     Vasopneumatic   Number Minutes Vasopneumatic  --   declined; will ice at home     Manual Therapy   Manual Therapy Taping    [place] I strip of [place] tape applied to Rt lateral ankle and perpendicular strip over malleoli with 15% to decompress tissue and increase proprioception.      Ankle Exercises: Stretches   Soleus Stretch 2 reps   15 sec, 2 reps   Gastroc Stretch 2 reps;30 seconds      Ankle Exercises: Seated   BAPS Sitting;Level 5;10 reps   PF/DF, eversion/inv. CW/CCW x 5 each   Other Seated Ankle Exercises Rt ankle PF with blue band x 10 reps, 2 sets  PT Education - 11/20/20 1053    Education Details ktape info    Person(s) Educated Patient    Methods Explanation;Handout    Comprehension Verbalized understanding               PT Long Term Goals - 11/04/20 1058      PT LONG TERM GOAL #1   Title Pt will be independent with HEP    Time 6    Period Weeks    Status On-going      PT LONG TERM GOAL #2   Title Pt will improve FOTO to >= 64 to demo improved functional mobility    Time 6    Period Weeks    Status On-going      PT LONG TERM GOAL #3   Title Pt will improve Rt ankle strength to 4/5 to be able to perform IADLs with decreased pain    Time 6    Period Weeks    Status On-going      PT LONG TERM GOAL #4   Title Pt will improve Rt ankle ROM by 10 degrees and all directions to perform gait with decreased pain and abnormalities    Time 6    Period Weeks    Status Partially Met      PT LONG TERM GOAL #5   Title Pt will perform gait household distances with normal gait pattern and pain <= 2/10    Time 6    Period Weeks    Status On-going                 Plan - 11/20/20 1102    Clinical Impression  Statement Pt tolerated Rt ankle ROM exercises well, including seated BAPS on L5.  Pt tolerating gait with FWB without assistive device without difficulty or pain.  PROM of DF improved by 4 degrees.  Progressing well towards goals.    PT Frequency 2x / week    PT Duration 6 weeks    PT Treatment/Interventions Iontophoresis 94m/ml Dexamethasone;Cryotherapy;Electrical Stimulation;Gait training;DME Instruction;Moist Heat;Therapeutic activities;Therapeutic exercise;Balance training;Neuromuscular re-education;Vasopneumatic Device;Taping;Manual techniques;Patient/family education;Stair training;Passive range of motion;Dry needling    PT Next Visit Plan continue to progress full wt bearing during gait and mobility.  progress ankle strength and ROM    Consulted and Agree with Plan of Care Patient           Patient will benefit from skilled therapeutic intervention in order to improve the following deficits and impairments:  Abnormal gait,Difficulty walking,Pain,Decreased activity tolerance,Decreased strength,Increased edema,Decreased range of motion  Visit Diagnosis: Pain in right ankle and joints of right foot  Difficulty in walking, not elsewhere classified  Muscle weakness (generalized)  Decreased proprioception of joint of right foot     Problem List Patient Active Problem List   Diagnosis Date Noted  . Acute maxillary sinusitis 05/20/2020  . Calculus of gallbladder without cholecystitis without obstruction 12/13/2019  . Oral contraceptive pill surveillance 04/19/2019  . Epiphora, right 04/19/2019  . Deviated nasal septum 04/19/2019  . Hypertension goal BP (blood pressure) < 130/80 03/15/2019  . GAD (generalized anxiety disorder) 02/08/2019  . Encounter for initial prescription of contraceptive pills 02/08/2019  . Elevated blood pressure reading 02/08/2019  . Osteochondral defect of talus 10/01/2018  . Right lumbar radiculitis 10/01/2018  . Phlebolith 09/29/2018  . Breakthrough  bleeding associated with intrauterine device (IUD) 11/17/2017    JKerin Perna PTA 11/20/20 11:49 AM  [address] Phone: [phone]   Fax:  [phone]  Name: KABRIA HETZER MRN: [id number] Date of Birth: Apr 15, 1981

## 2020-11-25 ENCOUNTER — Other Ambulatory Visit: Payer: Self-pay

## 2020-11-25 ENCOUNTER — Ambulatory Visit (INDEPENDENT_AMBULATORY_CARE_PROVIDER_SITE_OTHER): Payer: 59 | Admitting: Physical Therapy

## 2020-11-25 ENCOUNTER — Telehealth: Payer: Self-pay | Admitting: *Deleted

## 2020-11-25 DIAGNOSIS — M25571 Pain in right ankle and joints of right foot: Secondary | ICD-10-CM | POA: Diagnosis not present

## 2020-11-25 DIAGNOSIS — R262 Difficulty in walking, not elsewhere classified: Secondary | ICD-10-CM

## 2020-11-25 DIAGNOSIS — M25871 Other specified joint disorders, right ankle and foot: Secondary | ICD-10-CM

## 2020-11-25 DIAGNOSIS — M6281 Muscle weakness (generalized): Secondary | ICD-10-CM | POA: Diagnosis not present

## 2020-11-25 MED FILL — BLISOVI FE 1/20 1-20 MG-MCG: 1-20 | 84 days supply | Qty: 84 | Fill #3

## 2020-11-25 NOTE — Telephone Encounter (Signed)
That is OK. Can you please write the note? Thank you!

## 2020-11-25 NOTE — Therapy (Signed)
[place], [place], [address] Phone: [phone]   Fax:  [phone]  Physical Therapy Treatment  Patient Details  Name: Rachel Levy MRN: [id number] Date of Birth: 03-29-1981 Referring Provider (PT): Celesta Gentile   Encounter Date: 11/25/2020   PT End of Session - 11/25/20 0926    Visit Number 11    Number of Visits 12    Date for PT Re-Evaluation 12/03/20    PT Start Time 0849    PT Stop Time 0935    PT Time Calculation (min) 46 min    Activity Tolerance Patient tolerated treatment well    Behavior During Therapy [facility] for tasks assessed/performed           Past Medical History:  Diagnosis Date  . Anxiety     Past Surgical History:  Procedure Laterality Date  . no history of surgeries      There were no vitals filed for this visit.   Subjective Assessment - 11/25/20 0859    Subjective Pt reports her Rt ankle was sore and swollen after last visit, "but it's pretty much swollen every night".  She complains of ankle stiffness first thing in morning.  Pain at bedtime remains constant. She didn't notice a difference with the tape application.    Patient Stated Goals improve mobility and ROM    Currently in Pain? No/denies              [facility] PT Assessment - 11/25/20 0001      Assessment   Medical Diagnosis Rt OCD talus repair with medial malleolar osteotomy    Referring Provider (PT) Celesta Gentile    Next MD Visit 01/02/21            [facility] Adult PT Treatment/Exercise - 11/25/20 0001      Ambulation/Gait   Ambulation Distance (Feet) 100 Feet    Assistive device None    Gait Pattern Step-through pattern;Decreased arm swing - right;Decreased arm swing - left;Decreased stance time - right   with boot on Rt foot   Pre-Gait Activities tactile cues for reciprocal arm swing, to even stance time between feet.      Knee/Hip Exercises: Stretches   Sports administrator Left;Right;2  reps;30 seconds   prone with strap     Knee/Hip Exercises: Standing   SLS Rt SLS in Boot with intermittent support on rail x 5 sec x 4 reps.      Knee/Hip Exercises: Seated   Sit to Sand 10 reps;without UE support   boot and shoe off, combined with small side step Rt/Lt.  Mirror for visual feedback for even wt in feet.     Vasopneumatic   Vasopnuematic Location  Ankle    Vasopneumatic Pressure High    Vasopneumatic Temperature  34      Manual Therapy   Passive ROM Rt ankle all directions to tolerance      Ankle Exercises: Seated   BAPS Sitting;Level 5;10 reps   PF/DF, eversion/inv. CW/CCW x 5 each     Ankle Exercises: Stretches   Other Stretch seated heel slides for soleus stretch x 10 sec x 5 reps      Ankle Exercises: Aerobic   Nustep L5: 5 min for Rt ankle ROM and warm up              PT Long Term Goals - 11/04/20 1058      PT LONG TERM GOAL #1   Title Pt will  be independent with HEP    Time 6    Period Weeks    Status On-going      PT LONG TERM GOAL #2   Title Pt will improve FOTO to >= 64 to demo improved functional mobility    Time 6    Period Weeks    Status On-going      PT LONG TERM GOAL #3   Title Pt will improve Rt ankle strength to 4/5 to be able to perform IADLs with decreased pain    Time 6    Period Weeks    Status On-going      PT LONG TERM GOAL #4   Title Pt will improve Rt ankle ROM by 10 degrees and all directions to perform gait with decreased pain and abnormalities    Time 6    Period Weeks    Status Partially Met      PT LONG TERM GOAL #5   Title Pt will perform gait household distances with normal gait pattern and pain <= 2/10    Time 6    Period Weeks    Status On-going                 Plan - 11/25/20 4540    Clinical Impression Statement Pt demonstrates decreased stance time on RLE during short gait trials.  She is able to tolerate up to 4 sec in SLS on Rt with boot on.  Pt tolerated all other exercises well, with min  increase in pain.  Pt making good gains towards remaining goals.    PT Frequency 2x / week    PT Duration 6 weeks    PT Treatment/Interventions Iontophoresis 53m/ml Dexamethasone;Cryotherapy;Electrical Stimulation;Gait training;DME Instruction;Moist Heat;Therapeutic activities;Therapeutic exercise;Balance training;Neuromuscular re-education;Vasopneumatic Device;Taping;Manual techniques;Patient/family education;Stair training;Passive range of motion;Dry needling    PT Next Visit Plan continue to progress full wt bearing during gait and mobility.  progress ankle strength and ROM. End of POC; assess goals.    PT Home Exercise Plan Access Code: [email or web address]   Consulted and Agree with Plan of Care Patient           Patient will benefit from skilled therapeutic intervention in order to improve the following deficits and impairments:  Abnormal gait,Difficulty walking,Pain,Decreased activity tolerance,Decreased strength,Increased edema,Decreased range of motion  Visit Diagnosis: Pain in right ankle and joints of right foot  Difficulty in walking, not elsewhere classified  Muscle weakness (generalized)  Decreased proprioception of joint of right foot     Problem List Patient Active Problem List   Diagnosis Date Noted  . Acute maxillary sinusitis 05/20/2020  . Calculus of gallbladder without cholecystitis without obstruction 12/13/2019  . Oral contraceptive pill surveillance 04/19/2019  . Epiphora, right 04/19/2019  . Deviated nasal septum 04/19/2019  . Hypertension goal BP (blood pressure) < 130/80 03/15/2019  . GAD (generalized anxiety disorder) 02/08/2019  . Encounter for initial prescription of contraceptive pills 02/08/2019  . Elevated blood pressure reading 02/08/2019  . Osteochondral defect of talus 10/01/2018  . Right lumbar radiculitis 10/01/2018  . Phlebolith 09/29/2018  . Breakthrough bleeding associated with intrauterine device (IUD) 11/17/2017   JKerin Perna  PTA 11/25/20 9:37 AM  [facility] [address]Phone: [phone]  Fax:  [phone] Name: Rachel GRADELMRN: [id number] of Birth: [birth year]

## 2020-11-25 NOTE — Telephone Encounter (Signed)
Called and left Vmessage that patient may pick up note from front desk, [place] office.

## 2020-11-25 NOTE — Telephone Encounter (Signed)
Patient is now walking in a boot and is requesting a return back to work note by April 1st week. Please advise.

## 2020-11-26 ENCOUNTER — Encounter: Payer: Self-pay | Admitting: *Deleted

## 2020-11-26 ENCOUNTER — Encounter: Payer: Self-pay | Admitting: Podiatry

## 2020-11-28 ENCOUNTER — Other Ambulatory Visit: Payer: Self-pay

## 2020-11-28 ENCOUNTER — Ambulatory Visit (INDEPENDENT_AMBULATORY_CARE_PROVIDER_SITE_OTHER): Payer: 59 | Admitting: Physical Therapy

## 2020-11-28 DIAGNOSIS — M25571 Pain in right ankle and joints of right foot: Secondary | ICD-10-CM

## 2020-11-28 DIAGNOSIS — R262 Difficulty in walking, not elsewhere classified: Secondary | ICD-10-CM

## 2020-11-28 DIAGNOSIS — M6281 Muscle weakness (generalized): Secondary | ICD-10-CM | POA: Diagnosis not present

## 2020-11-28 DIAGNOSIS — M25871 Other specified joint disorders, right ankle and foot: Secondary | ICD-10-CM

## 2020-11-28 NOTE — Therapy (Signed)
[place], [place], [address] Phone: [phone]   Fax:  [phone]  Physical Therapy Treatment and Recertification  Patient Details  Name: Rachel Levy MRN: [id number] Date of Birth: Jan 23, 1981 Referring Provider (PT): Celesta Gentile   Encounter Date: 11/28/2020   PT End of Session - 11/28/20 1227    Visit Number 12    Number of Visits 24    Date for PT Re-Evaluation 01/09/21    PT Start Time 1324    PT Stop Time 1235    PT Time Calculation (min) 50 min    Activity Tolerance Patient tolerated treatment well    Behavior During Therapy [facility] for tasks assessed/performed           Past Medical History:  Diagnosis Date  . Anxiety     Past Surgical History:  Procedure Laterality Date  . no history of surgeries      There were no vitals filed for this visit.   Subjective Assessment - 11/28/20 1146    Subjective Pt states her ankle was swollen and sore yesterday after she vacuumed and mopped for the first time in months    Patient Stated Goals improve mobility and ROM    Pain Score 2     Pain Location Ankle    Pain Orientation Right    Pain Descriptors / Indicators Aching              [facility] PT Assessment - 11/28/20 0001      Observation/Other Assessments   Focus on Therapeutic Outcomes (FOTO)  54      AROM   Right Ankle Dorsiflexion 2    Right Ankle Plantar Flexion 42    Right Ankle Inversion 30    Right Ankle Eversion 30      Strength   Right Ankle Dorsiflexion 4/5    Right Ankle Plantar Flexion 4+/5    Right Ankle Inversion 4/5    Right Ankle Eversion 4/5                         [facility] Adult PT Treatment/Exercise - 11/28/20 0001      Ambulation/Gait   Ambulation Distance (Feet) 100 Feet   x 2   Assistive device None   in shoe   Gait Pattern Step-to pattern   cues to progress to step through pattern   Pre-Gait Activities forward stepping with Lt LE,  tap ups to 4'' step with LT LE      Knee/Hip Exercises: Aerobic   Nustep L5 x 5 minutes for warm up      Knee/Hip Exercises: Seated   Sit to Sand 10 reps;without UE support   in shoe     Vasopneumatic   Number Minutes Vasopneumatic  10 minutes    Vasopnuematic Location  Ankle    Vasopneumatic Pressure High    Vasopneumatic Temperature  34      Manual Therapy   Passive ROM Rt ankle all directions to tolerance      Ankle Exercises: Seated   BAPS Sitting;Level 5;5 reps   all directions x 5   Other Seated Ankle Exercises Rt ankle PF x 20 with blue TB      Ankle Exercises: Stretches   Soleus Stretch 2 reps;30 seconds    Gastroc Stretch 2 reps;30 seconds  PT Long Term Goals - 11/28/20 1149      PT LONG TERM GOAL #1   Title Pt will be independent with HEP    Time 6    Period Weeks    Status On-going    Target Date 01/09/21      PT LONG TERM GOAL #2   Title Pt will improve FOTO to >= 64 to demo improved functional mobility    Baseline 54 on 11/28/20    Time 6    Period Weeks    Status On-going    Target Date 01/09/21      PT LONG TERM GOAL #3   Title Pt will improve Rt ankle strength to 4/5 to be able to perform IADLs with decreased pain    Status Achieved      PT LONG TERM GOAL #4   Title Pt will improve Rt ankle ROM by 10 degrees and all directions to perform gait with decreased pain and abnormalities    Time 6    Period Weeks    Status Partially Met    Target Date 01/09/21      PT LONG TERM GOAL #5   Title Pt will perform gait household distances with normal gait pattern and pain <= 2/10    Time 6    Period Weeks    Status On-going    Target Date 01/09/21                 Plan - 11/28/20 1228    Clinical Impression Statement Pt plans to return to work next week, wearing boot.  Pt has made good progress with PT and is progressing towards goals. Pt shows increased strength and ROM as well as improvements with gait and  decreased pain. Pt will benefit from continued PT to progress gait and ROM and improve functional mobility    Rehab Potential Good    PT Frequency 2x / week    PT Duration 6 weeks    PT Treatment/Interventions Iontophoresis 76m/ml Dexamethasone;Cryotherapy;Electrical Stimulation;Gait training;DME Instruction;Moist Heat;Therapeutic activities;Therapeutic exercise;Balance training;Neuromuscular re-education;Vasopneumatic Device;Taping;Manual techniques;Patient/family education;Stair training;Passive range of motion;Dry needling    PT Next Visit Plan continue to progress full wt bearing during gait and mobility.  progress ankle strength and ROM.    PT Home Exercise Plan Access Code: [email or web address]          Patient will benefit from skilled therapeutic intervention in order to improve the following deficits and impairments:     Visit Diagnosis: Pain in right ankle and joints of right foot - Plan: PT plan of care cert/re-cert  Difficulty in walking, not elsewhere classified - Plan: PT plan of care cert/re-cert  Muscle weakness (generalized) - Plan: PT plan of care cert/re-cert  Decreased proprioception of joint of right foot - Plan: PT plan of care cert/re-cert     Problem List Patient Active Problem List   Diagnosis Date Noted  . Acute maxillary sinusitis 05/20/2020  . Calculus of gallbladder without cholecystitis without obstruction 12/13/2019  . Oral contraceptive pill surveillance 04/19/2019  . Epiphora, right 04/19/2019  . Deviated nasal septum 04/19/2019  . Hypertension goal BP (blood pressure) < 130/80 03/15/2019  . GAD (generalized anxiety disorder) 02/08/2019  . Encounter for initial prescription of contraceptive pills 02/08/2019  . Elevated blood pressure reading 02/08/2019  . Osteochondral defect of talus 10/01/2018  . Right lumbar radiculitis 10/01/2018  . Phlebolith 09/29/2018  . Breakthrough bleeding associated with intrauterine device (IUD) 11/17/2017    DONAWERTH,KAREN, PT  DONAWERTH,KAREN 11/28/2020, 12:32 PM  [facility] [place], [place], [address] Phone: [phone]   Fax:  [phone]  Name: Rachel Levy MRN: [id number] Date of Birth: 1980/12/04

## 2020-12-01 ENCOUNTER — Encounter: Payer: 59 | Admitting: Rehabilitative and Restorative Service Providers"

## 2020-12-02 ENCOUNTER — Encounter: Payer: 59 | Admitting: Physical Therapy

## 2020-12-04 ENCOUNTER — Ambulatory Visit (INDEPENDENT_AMBULATORY_CARE_PROVIDER_SITE_OTHER): Payer: 59 | Admitting: Physical Therapy

## 2020-12-04 ENCOUNTER — Encounter: Payer: Self-pay | Admitting: Physical Therapy

## 2020-12-04 ENCOUNTER — Other Ambulatory Visit: Payer: Self-pay

## 2020-12-04 DIAGNOSIS — R262 Difficulty in walking, not elsewhere classified: Secondary | ICD-10-CM

## 2020-12-04 DIAGNOSIS — M25871 Other specified joint disorders, right ankle and foot: Secondary | ICD-10-CM | POA: Diagnosis not present

## 2020-12-04 DIAGNOSIS — M25571 Pain in right ankle and joints of right foot: Secondary | ICD-10-CM | POA: Diagnosis not present

## 2020-12-04 DIAGNOSIS — M6281 Muscle weakness (generalized): Secondary | ICD-10-CM | POA: Diagnosis not present

## 2020-12-04 NOTE — Therapy (Addendum)
[place] [address] [place], [place], [address] Phone: [phone]   Fax:  [phone]  Physical Therapy Treatment and discharge  Patient Details  Name: Rachel Levy MRN: [id number] Date of Birth: 12/11/1980 Referring Provider (PT): Celesta Gentile   Encounter Date: 12/04/2020   PT End of Session - 12/04/20 1234    Visit Number 13    Number of Visits 24    Date for PT Re-Evaluation 01/09/21    PT Start Time 1148    PT Stop Time 1230    PT Time Calculation (min) 42 min    Activity Tolerance Patient tolerated treatment well    Behavior During Therapy [facility] for tasks assessed/performed           Past Medical History:  Diagnosis Date  . Anxiety     Past Surgical History:  Procedure Laterality Date  . no history of surgeries      There were no vitals filed for this visit.   Subjective Assessment - 12/04/20 1152    Subjective Pt reports she returned to work (10 hr days) with boot.  End of work day is the worst with increased pain and swelling (5-6/10).  "I get to a point I can't walk".  Rt knee is now a constant burn at end of day.    Patient Stated Goals improve mobility and ROM    Currently in Pain? Yes    Pain Score 3     Pain Location Knee    Pain Orientation Right    Pain Descriptors / Indicators Burning    Aggravating Factors  walking in boot    Pain Relieving Factors ice              [facility] PT Assessment - 12/04/20 0001      Assessment   Medical Diagnosis Rt OCD talus repair with medial malleolar osteotomy    Referring Provider (PT) Celesta Gentile    Next MD Visit 01/02/21            [facility] Adult PT Treatment/Exercise - 12/04/20 0001      Ambulation/Gait   Ambulation Distance (Feet) 250 Feet   2nd trial 160 ft   Assistive device L Axillary Crutch    Gait Pattern Step-through pattern;Decreased arm swing - right;Decreased stance time - right;Decreased weight shift to right    Gait  Comments cues for even stance time and length, cues to roll through toes.cues for slow pace to focus on form.  2nd trial with increased pace.      Knee/Hip Exercises: Stretches   Sports administrator Right;30 seconds;4 reps   prone with strap   Gastroc Stretch Right;Left;2 reps;20 seconds      Knee/Hip Exercises: Aerobic   Recumbent Bike L3: 5 min for warm up      Knee/Hip Exercises: Standing   Step Down Left;1 set;5 reps;Hand Hold: 2;Step Height: 2"   and retro step up   Step Down Limitations Lt step down 6" step with crutch x 6 reps    SLS Rt SLS in shoe with UE support x 30 sec. without support up to 3 sec x 3 reps.      Knee/Hip Exercises: Seated   Sit to Sand 10 reps;without UE support   in shoe     Modalities   Modalities --   pt declined.     Ankle Exercises: Standing   Heel Raises Both;15 reps    Toe Raise 15 reps  PT Long Term Goals - 11/28/20 1149      PT LONG TERM GOAL #1   Title Pt will be independent with HEP    Time 6    Period Weeks    Status On-going    Target Date 01/09/21      PT LONG TERM GOAL #2   Title Pt will improve FOTO to >= 64 to demo improved functional mobility    Baseline 54 on 11/28/20    Time 6    Period Weeks    Status On-going    Target Date 01/09/21      PT LONG TERM GOAL #3   Title Pt will improve Rt ankle strength to 4/5 to be able to perform IADLs with decreased pain    Status Achieved      PT LONG TERM GOAL #4   Title Pt will improve Rt ankle ROM by 10 degrees and all directions to perform gait with decreased pain and abnormalities    Time 6    Period Weeks    Status Partially Met    Target Date 01/09/21      PT LONG TERM GOAL #5   Title Pt will perform gait household distances with normal gait pattern and pain <= 2/10    Time 6    Period Weeks    Status On-going    Target Date 01/09/21                 Plan - 12/04/20 1237    Clinical Impression Statement Pt tolerated Rt SLS in shoe with UE support to  30 sec; only 3 sec without support.  Gait quality improved greatly with use of single axillary crutch and focus on even step length and weight shift.  Time spent speaking with pt regarding slow transition to using shoe for short household distances with single crutch, but continuing to use boot in community/ at work.  Pt tolerated all exercises well without increase in knee/ankle pain. Progressing well towards goals.    Rehab Potential Good    PT Frequency 2x / week    PT Duration 6 weeks    PT Treatment/Interventions Iontophoresis 24m/ml Dexamethasone;Cryotherapy;Electrical Stimulation;Gait training;DME Instruction;Moist Heat;Therapeutic activities;Therapeutic exercise;Balance training;Neuromuscular re-education;Vasopneumatic Device;Taping;Manual techniques;Patient/family education;Stair training;Passive range of motion;Dry needling    PT Next Visit Plan continue to progress full wt bearing during gait and mobility.  progress ankle strength and ROM.    PT Home Exercise Plan Access Code: [email or web address]   Consulted and Agree with Plan of Care Patient           Patient will benefit from skilled therapeutic intervention in order to improve the following deficits and impairments:  Abnormal gait,Difficulty walking,Pain,Decreased activity tolerance,Decreased strength,Increased edema,Decreased range of motion  Visit Diagnosis: Pain in right ankle and joints of right foot  Difficulty in walking, not elsewhere classified  Muscle weakness (generalized)  Decreased proprioception of joint of right foot     Problem List Patient Active Problem List   Diagnosis Date Noted  . Acute maxillary sinusitis 05/20/2020  . Calculus of gallbladder without cholecystitis without obstruction 12/13/2019  . Oral contraceptive pill surveillance 04/19/2019  . Epiphora, right 04/19/2019  . Deviated nasal septum 04/19/2019  . Hypertension goal BP (blood pressure) < 130/80 03/15/2019  . GAD (generalized anxiety  disorder) 02/08/2019  . Encounter for initial prescription of contraceptive pills 02/08/2019  . Elevated blood pressure reading 02/08/2019  . Osteochondral defect of talus 10/01/2018  . Right lumbar radiculitis 10/01/2018  .  Phlebolith 09/29/2018  . Breakthrough bleeding associated with intrauterine device (IUD) 11/17/2017   PHYSICAL THERAPY DISCHARGE SUMMARY  Visits from Start of Care: 13  Current functional level related to goals / functional outcomes: Improved gait, ROM and mobility   Remaining deficits: See above   Education / Equipment: HEP Plan: Patient agrees to discharge.  Patient goals were partially met. Patient is being discharged due to being pleased with the current functional level.  ?????    Isabelle Course, PT,DPT05/24/2211:59 AM   Kerin Perna, PTA 12/04/20 12:41 PM [place] [address] [place], [place], [address] Phone: [phone]   Fax:  [phone]  Name: Rachel Levy MRN: [id number] Date of Birth: 08-12-1981

## 2020-12-04 NOTE — Patient Instructions (Signed)
Access Code: [email or web address] URL: [email or web address] Date: 12/04/2020 Prepared by: [facility]  Exercises Supine Ankle Circles - 1 x daily - 7 x weekly - 3 sets - 10 reps Side to Side Weight Shift with Counter Support - 1 x daily - 7 x weekly - 3 sets - 10 reps Long Sitting Ankle Eversion with Resistance - 1 x daily - 7 x weekly - 3 sets - 10 reps Long Sitting Ankle Plantar Flexion with Resistance - 1 x daily - 7 x weekly - 3 sets - 10 reps Long Sitting Ankle Inversion with Resistance - 1 x daily - 7 x weekly - 3 sets - 10 reps Long Sitting Ankle Dorsiflexion with Anchored Resistance - 1 x daily - 7 x weekly - 3 sets - 10 reps Standing Gastroc Stretch - 1 x daily - 7 x weekly - 3 sets - 1 reps - 20-30 sec hold Standing Soleus Stretch - 1 x daily - 7 x weekly - 3 sets - 1 reps - 20-30 sec hold Supine Active Straight Leg Raise - 1 x daily - 3 x weekly - 2 sets - 10 reps Sidelying Hip Abduction - 1 x daily - 3 x weekly - 2 sets - 10 reps Sidelying Hip Adduction - 1 x daily - 3 x weekly - 2 sets - 10 reps Seated Dorsiflexion Stretch - 1 x daily - 7 x weekly - 1 sets - 5 reps - 10 hold Seated Anterior Tibialis Stretch - 1 x daily - 7 x weekly - 1 sets - 5 reps - 10 hold Figure 4 Bridge - 1 x daily - 3 x weekly - 1-2 sets - 10 reps Prone Hip Extension with Bent Knee - 1 x daily - 3 x weekly - 1 sets - 10 reps Prone Quadriceps Stretch with Strap - 1 x daily - 7 x weekly - 3 sets - 3 reps - 30 sec hold Hooklying Hamstring Stretch with Strap - 1 x daily - 7 x weekly - 3 sets - 3 reps - 30 sec hold  Added: Heel Toe Raises with Unilateral Counter Support - 1 x daily - 7 x weekly - 2 sets - 10 reps Single Leg Stance with Support - 1 x daily - 7 x weekly - 1 sets - 3 reps - 30 hold Forward Step Down - 1 x daily - 7 x weekly - 1 sets - 10 reps  Pt instructed to delete SLR in all directions if hip feels = to LLE.

## 2020-12-05 ENCOUNTER — Encounter: Payer: 59 | Admitting: Physical Therapy

## 2020-12-17 ENCOUNTER — Other Ambulatory Visit (HOSPITAL_COMMUNITY): Payer: Self-pay

## 2020-12-17 ENCOUNTER — Other Ambulatory Visit: Payer: Self-pay | Admitting: Osteopathic Medicine

## 2020-12-17 MED ORDER — SERTRALINE HCL 25 MG PO TABS
ORAL_TABLET | Freq: Every day | ORAL | 0 refills | Status: DC
  Filled 2020-12-17 – 2020-12-23 (×2): qty 90, 90d supply, fill #0

## 2020-12-22 ENCOUNTER — Other Ambulatory Visit (HOSPITAL_COMMUNITY): Payer: Self-pay

## 2020-12-23 ENCOUNTER — Other Ambulatory Visit (HOSPITAL_COMMUNITY): Payer: Self-pay

## 2020-12-23 MED FILL — Amlodipine Besylate Tab 5 MG (Base Equivalent): ORAL | 90 days supply | Qty: 90 | Fill #0 | Status: AC

## 2021-01-02 ENCOUNTER — Encounter: Payer: Self-pay | Admitting: Podiatry

## 2021-01-02 ENCOUNTER — Other Ambulatory Visit: Payer: Self-pay

## 2021-01-02 ENCOUNTER — Ambulatory Visit (INDEPENDENT_AMBULATORY_CARE_PROVIDER_SITE_OTHER): Payer: 59 | Admitting: Podiatry

## 2021-01-02 DIAGNOSIS — M19071 Primary osteoarthritis, right ankle and foot: Secondary | ICD-10-CM | POA: Diagnosis not present

## 2021-01-02 DIAGNOSIS — M958 Other specified acquired deformities of musculoskeletal system: Secondary | ICD-10-CM | POA: Diagnosis not present

## 2021-01-02 NOTE — Progress Notes (Signed)
Subjective: Rachel Levy is a 40 y.o. is seen today in office s/p RIGHT ankle medial malleolar osteotomy with osteochondral repair preformed on 08/13/2020.  Overall states that she is doing well.  She still wears the cam boot at work because also the restrictions have said.  She does still get some swelling towards the end of the day.  She states that the ankle feels much better did prior to surgery.  She also states the left ankle is been doing well and not causing significant discomfort. Denies any fevers, chills, nausea, vomiting.  No calf pain, chest pain, shortness of breath.   Objective: General: No acute distress, AAOx3  DP/PT pulses palpable 2/4, CRT < 3 sec to all digits.  Normal color and temperature gradient. Protective sensation intact. Motor function intact.  RIGHT foot: Incisions are healing well scars are well formed.  Minimal edema.  There is no erythema or warmth.  No significant tenderness palpation at surgical site.  Ankle joint range of motion there is no pain or crepitation with range of motion of the ankle joint there is no restriction.  No pain with subtalar joint range of motion.    No area pinpoint tenderness.  Flexor, extensor tendons appear to be intact. No other open lesions or pre-ulcerative lesions.  No pain with calf compression, swelling, warmth, erythema.   Assessment and Plan:  Status post RIGHT ankle surgery  -Treatment options discussed including all alternatives, risks, and complications -Repeat x-rays. -Overall she is doing well.  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  We discussed gradually increasing to further exercise and activity level.  Continue with compression sock as well as supportive shoes.  Vivi Barrack DPM

## 2021-02-05 ENCOUNTER — Other Ambulatory Visit: Payer: Self-pay | Admitting: Osteopathic Medicine

## 2021-02-05 ENCOUNTER — Other Ambulatory Visit: Payer: Self-pay | Admitting: Medical-Surgical

## 2021-02-05 DIAGNOSIS — Z30011 Encounter for initial prescription of contraceptive pills: Secondary | ICD-10-CM

## 2021-02-05 DIAGNOSIS — F411 Generalized anxiety disorder: Secondary | ICD-10-CM

## 2021-02-05 NOTE — Telephone Encounter (Signed)
Routing to PCP

## 2021-02-06 ENCOUNTER — Other Ambulatory Visit (HOSPITAL_COMMUNITY): Payer: Self-pay

## 2021-02-06 MED ORDER — ALPRAZOLAM 0.25 MG PO TABS
0.2500 mg | ORAL_TABLET | Freq: Two times a day (BID) | ORAL | 2 refills | Status: DC | PRN
  Filled 2021-02-06: qty 20, 10d supply, fill #0

## 2021-02-06 MED ORDER — NORETHIN ACE-ETH ESTRAD-FE 1-20 MG-MCG PO TABS
1.0000 | ORAL_TABLET | Freq: Every day | ORAL | 3 refills | Status: DC
  Filled 2021-02-06: qty 84, 84d supply, fill #0
  Filled 2021-05-15: qty 84, 84d supply, fill #1
  Filled 2021-08-09: qty 84, 84d supply, fill #2
  Filled 2021-10-21: qty 84, 84d supply, fill #3

## 2021-03-31 IMAGING — DX DG FOOT COMPLETE 3+V*L*
3 series · 3 of 3 positions shown · non-contrast
Comparison: None.

CLINICAL DATA: Foot pain

EXAM:
LEFT FOOT - COMPLETE 3+ VIEW

[foot ap]
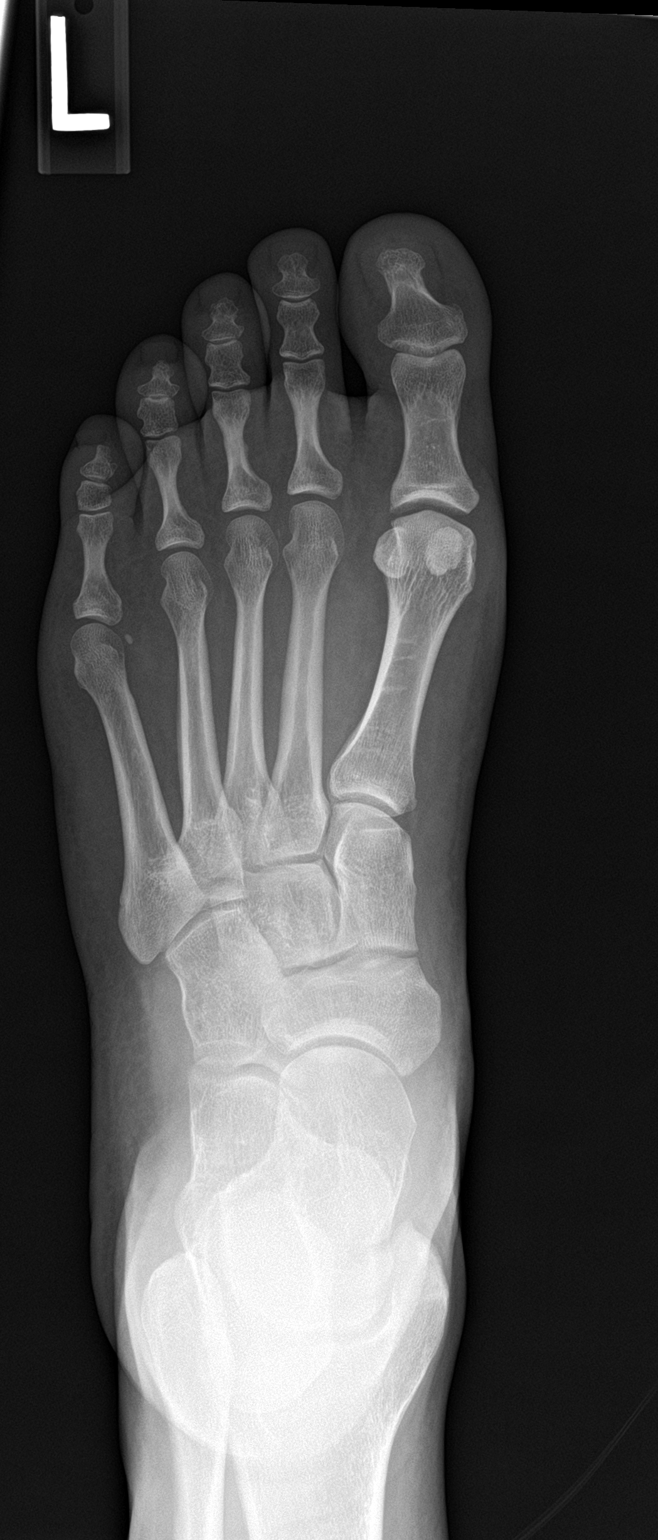

[foot obl]
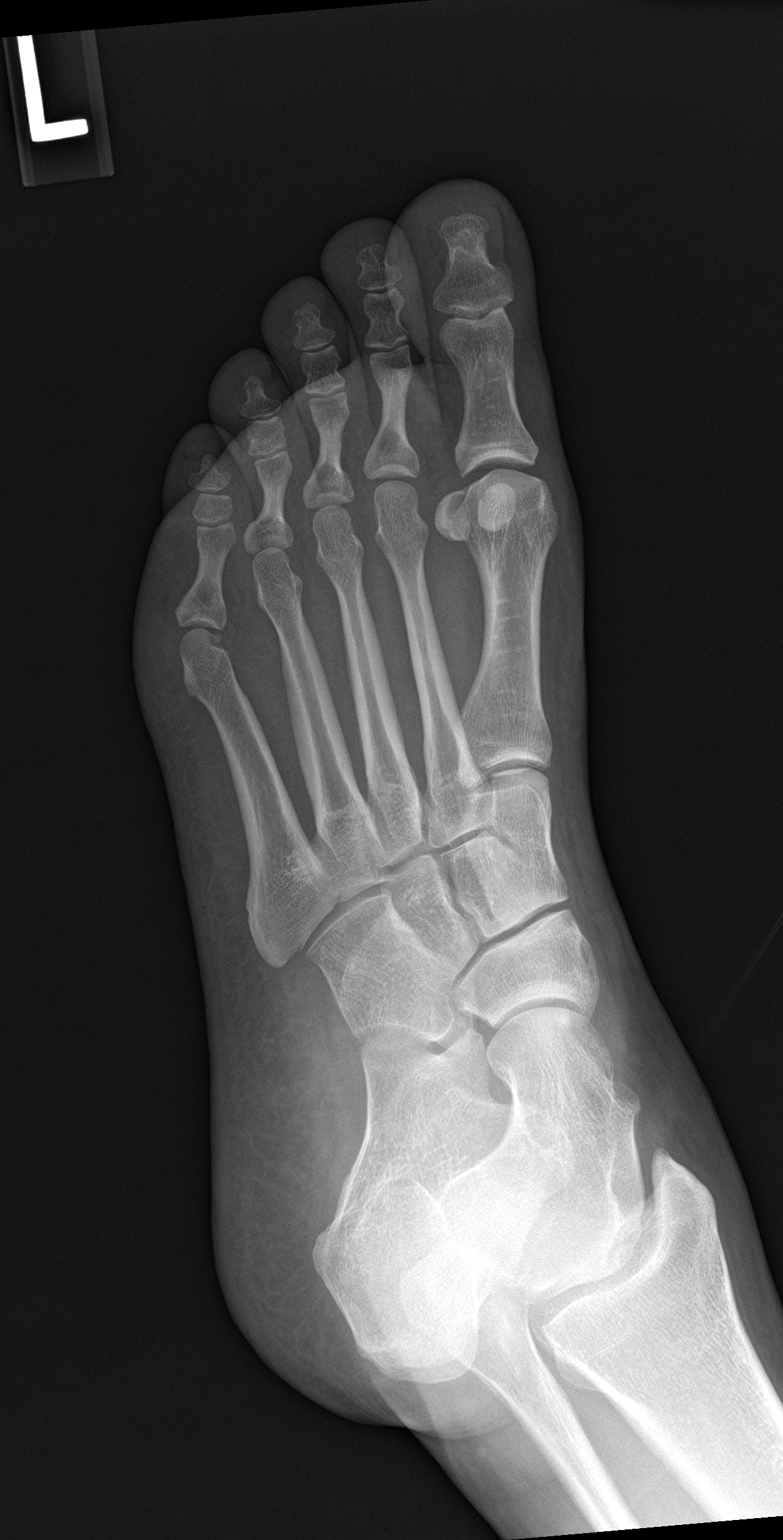

[foot lat]
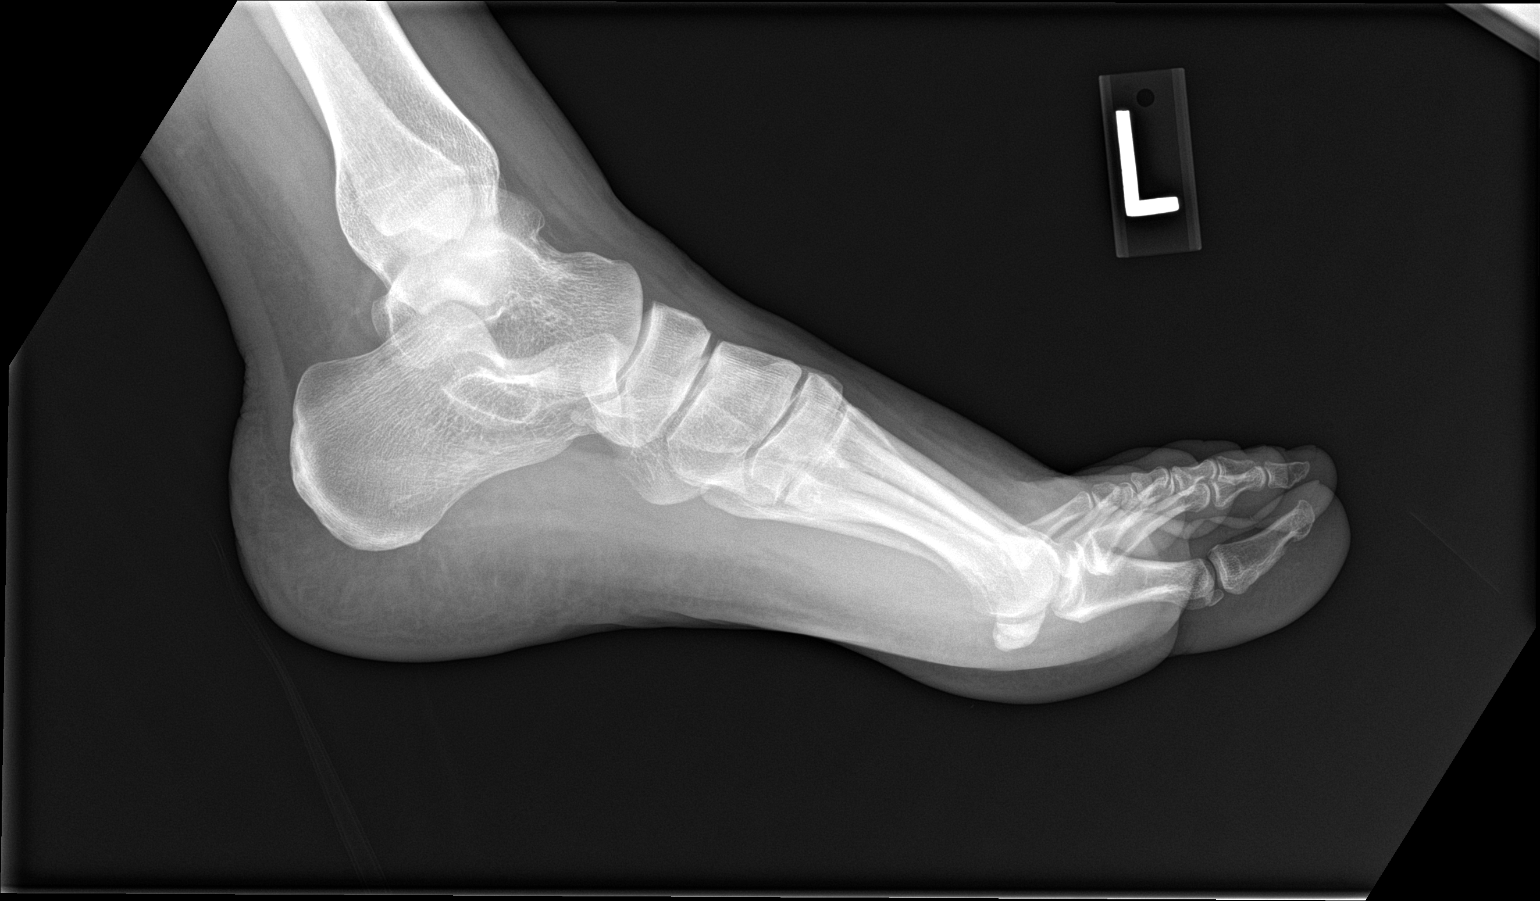

[3 of 3 positions shown; findings below may reference images not displayed]

FINDINGS: There is no evidence of fracture or dislocation. There is no
evidence of arthropathy or other focal bone abnormality. Soft
tissues are unremarkable.
IMPRESSION: Negative.

## 2021-03-31 IMAGING — DX DG ANKLE COMPLETE 3+V*L*
3 series · 3 of 3 positions shown · non-contrast
Comparison: None.

CLINICAL DATA: Left ankle pain

EXAM:
LEFT ANKLE COMPLETE - 3+ VIEW

[ankle ap]
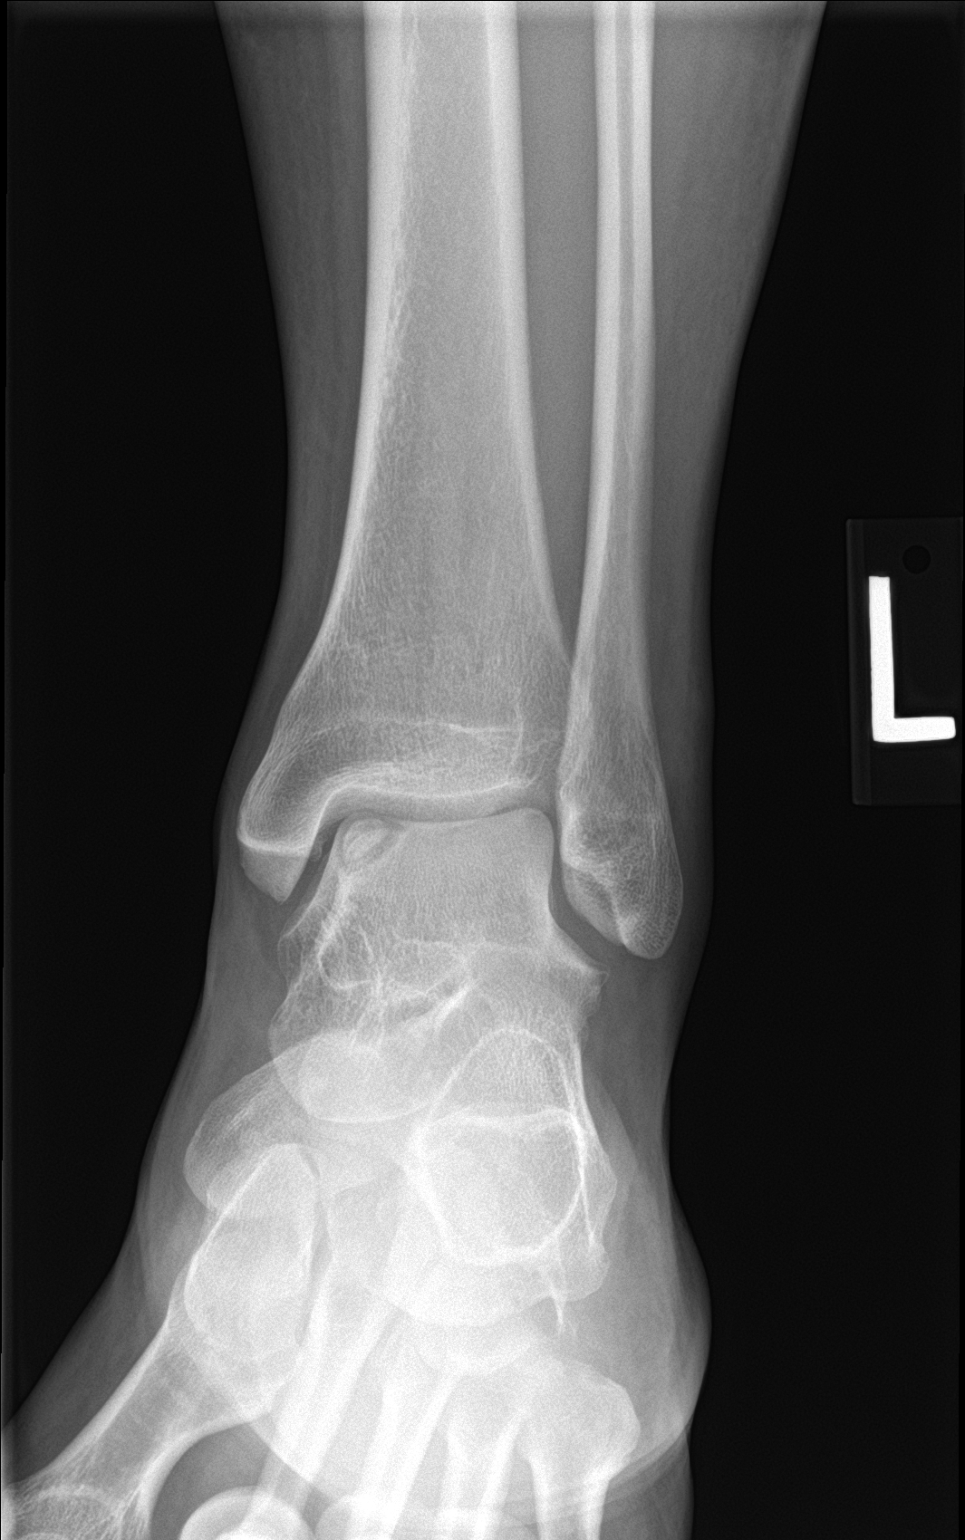

[ankle obl]
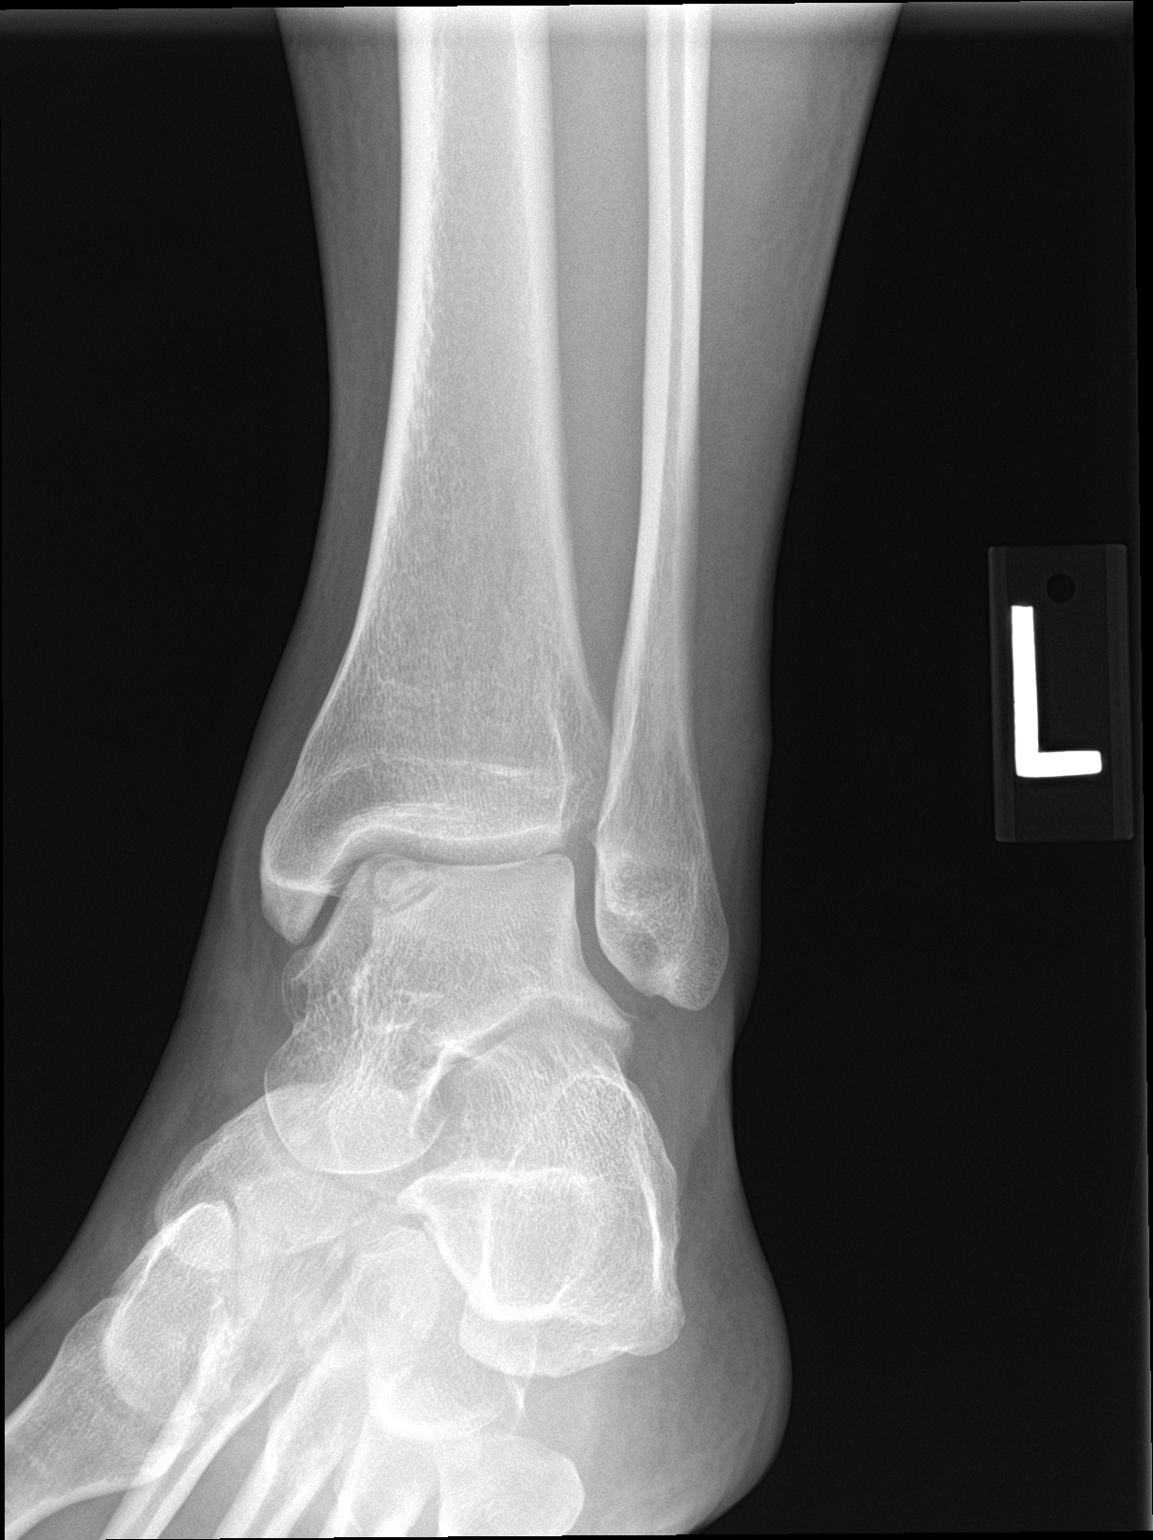

[ankle lat]
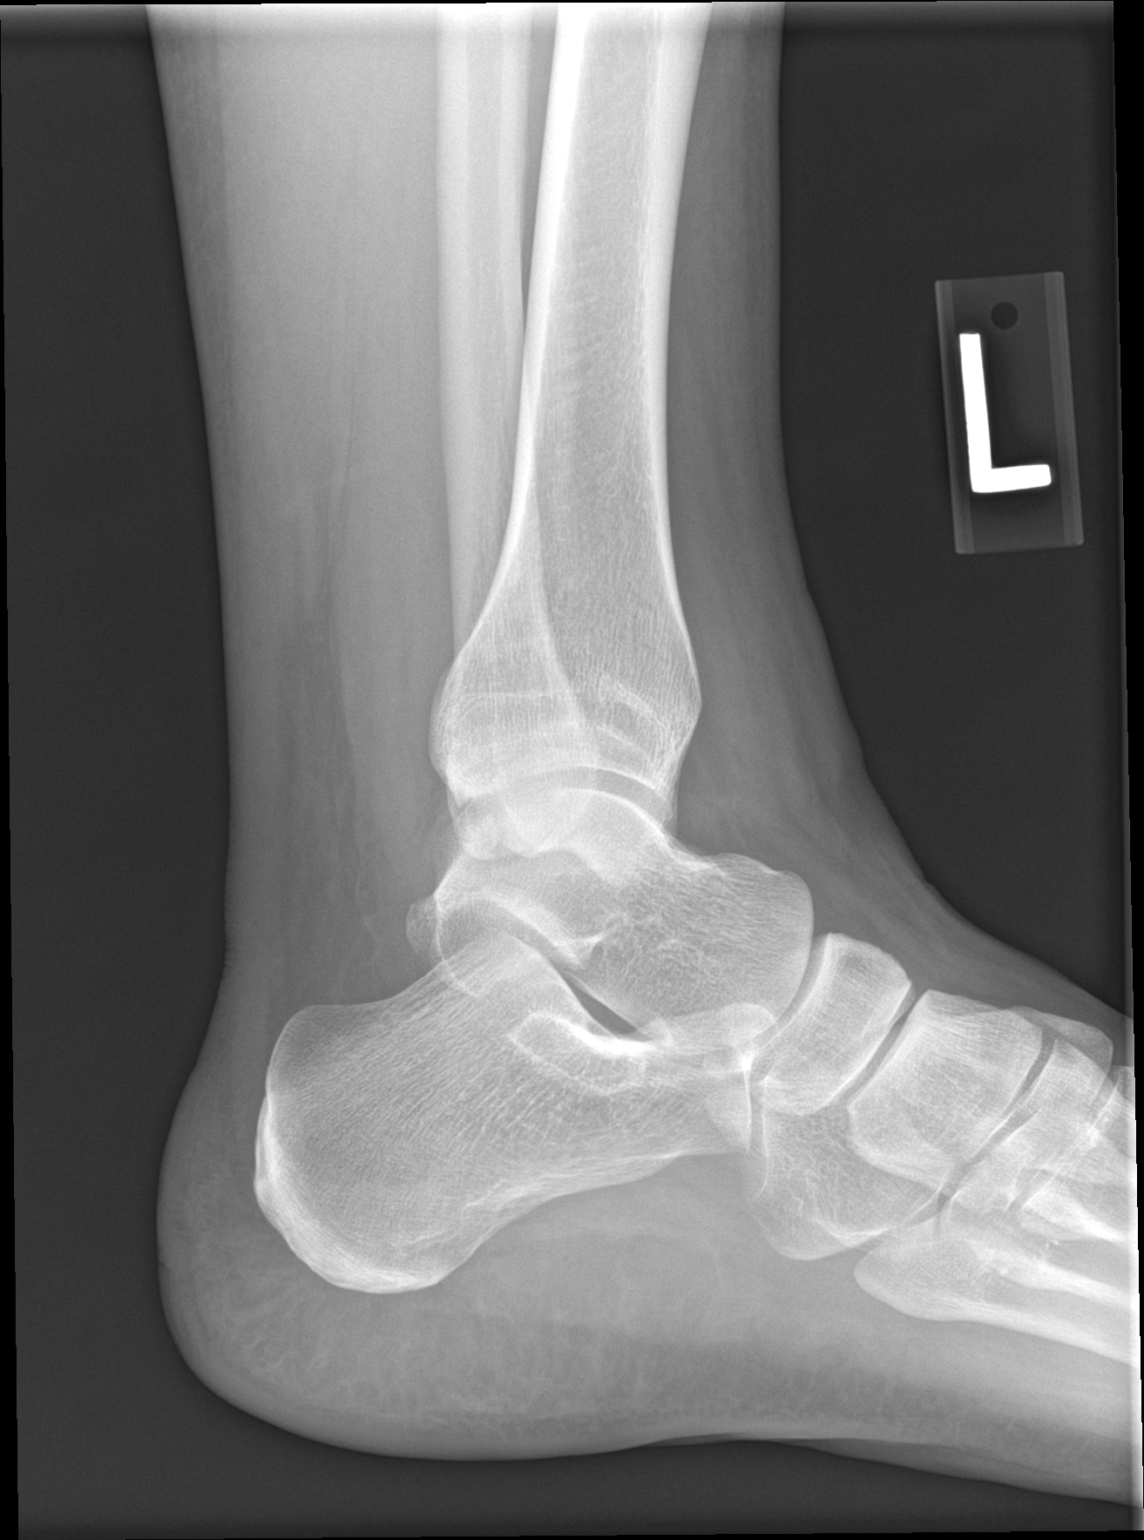

[3 of 3 positions shown; findings below may reference images not displayed]

FINDINGS: No acute displaced fracture or malalignment. 9 mm osteochondral
lesion at the medial talar dome.
IMPRESSION: 9 mm osteochondral lesion at the medial talar dome. Consider further
evaluation with MRI.

## 2021-04-05 ENCOUNTER — Other Ambulatory Visit: Payer: Self-pay | Admitting: Osteopathic Medicine

## 2021-04-05 MED FILL — Amlodipine Besylate Tab 5 MG (Base Equivalent): ORAL | 90 days supply | Qty: 90 | Fill #1 | Status: AC

## 2021-04-06 ENCOUNTER — Other Ambulatory Visit (HOSPITAL_COMMUNITY): Payer: Self-pay

## 2021-04-06 MED ORDER — SERTRALINE HCL 25 MG PO TABS
25.0000 mg | ORAL_TABLET | Freq: Every day | ORAL | 0 refills | Status: DC
  Filled 2021-04-06: qty 30, 30d supply, fill #0

## 2021-04-10 ENCOUNTER — Other Ambulatory Visit: Payer: Self-pay

## 2021-04-10 ENCOUNTER — Encounter: Payer: Self-pay | Admitting: Podiatry

## 2021-04-10 ENCOUNTER — Ambulatory Visit (INDEPENDENT_AMBULATORY_CARE_PROVIDER_SITE_OTHER): Payer: 59 | Admitting: Podiatry

## 2021-04-10 ENCOUNTER — Other Ambulatory Visit (HOSPITAL_COMMUNITY): Payer: Self-pay

## 2021-04-10 ENCOUNTER — Ambulatory Visit (INDEPENDENT_AMBULATORY_CARE_PROVIDER_SITE_OTHER): Payer: 59

## 2021-04-10 DIAGNOSIS — M792 Neuralgia and neuritis, unspecified: Secondary | ICD-10-CM

## 2021-04-10 DIAGNOSIS — G8918 Other acute postprocedural pain: Secondary | ICD-10-CM | POA: Diagnosis not present

## 2021-04-10 DIAGNOSIS — M958 Other specified acquired deformities of musculoskeletal system: Secondary | ICD-10-CM | POA: Diagnosis not present

## 2021-04-10 DIAGNOSIS — M19071 Primary osteoarthritis, right ankle and foot: Secondary | ICD-10-CM

## 2021-04-10 DIAGNOSIS — S8251XA Displaced fracture of medial malleolus of right tibia, initial encounter for closed fracture: Secondary | ICD-10-CM | POA: Diagnosis not present

## 2021-04-10 MED ORDER — METHYLPREDNISOLONE 4 MG PO TBPK
ORAL_TABLET | ORAL | 0 refills | Status: DC
  Filled 2021-04-10: qty 21, 6d supply, fill #0

## 2021-04-11 NOTE — Progress Notes (Signed)
Subjective: 40 year old female presents the office today for long-term follow-up.  She previously underwent right ankle medial malleolus to mid osteochondral repair on August 13, 2020.  Since I last saw her she states that she is been having pain on both ankles.  She states that she has been having difficulty with ladders and being on her feet for long period of time without having discomfort to the ankles.  She is not as active as she would like to be.  She also describes burning on the sides of both of her legs.  She denies any recent injury or trauma or changes since her last saw her other than changing jobs.  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 Denies any systemic complaints such as fevers, chills, nausea, vomiting. No acute changes since last appointment, and no other complaints at this time.   Objective: AAO x3, NAD DP/PT pulses palpable bilaterally, CRT less than 3 seconds There is mild tenderness to palpation globally around the right ankle.  There is tenderness on the medial malleolus along the medial and lateral ankle gutters.  There is no crepitation with range of motion of the ankle joint but there is some discomfort on the right side.  She also gets similar symptoms on the left side but not as severe.  She is also describing burning sensations and she points along the lateral aspect of the legs.  There are some mild edema to the ankles but there is no erythema or warmth.  MMT 5/5. No pain with calf compression, swelling, warmth, erythema  Assessment: Right ankle pain > left; neuritis  Plan: -All treatment options discussed with the patient including all alternatives, risks, complications.  -Long discussion today in regards to her symptoms and care.  At this point we will start a Medrol Dosepak which I prescribed.  Recommended ankle brace on the right side for now.  Ice and elevation.  We will obtain x-rays of the right ankle.  If she continues to have symptoms we will get an  MRI of the right ankle. -Also concerned about the burning on the sides of both legs and symmetrical.  Also given her age would not multiple joint pains we discussed getting blood work to check for underlying autoimmune type arthritis.  -Patient encouraged to call the office with any questions, concerns, change in symptoms.   Vivi Barrack DPM

## 2021-04-13 ENCOUNTER — Telehealth: Payer: Self-pay | Admitting: Sports Medicine

## 2021-04-13 DIAGNOSIS — M2550 Pain in unspecified joint: Secondary | ICD-10-CM | POA: Insufficient documentation

## 2021-04-13 NOTE — Telephone Encounter (Signed)
Rheumatoid work-up would be reasonable, I will go ahead and order this since I will be managing and likely following up on it and she can just come to my office and have our in-house lab tech draw the blood work, less likely from the lumbar spine since I saw more L3 and L4 nerve root impingement which would be more knee/medial leg and foot but it something I can absolutely discuss with her. ___________________________________________ Ihor Austin. Benjamin Stain, M.D., ABFM., CAQSM. Primary Care and Sports Medicine [facility]  Adjunct Professor of Family Medicine  [employer]

## 2021-04-13 NOTE — Telephone Encounter (Signed)
-----   Message from Vivi Barrack, DPM sent at 04/11/2021  9:02 AM EDT ----- I saw Rachel Levy for a follow up on Friday for her ankles. She was doing well after the surgery but seems to be going backwards on it. I am going to continue to work on the ankles but wanted to touch base about a few other issues. She has been getting burning on the lateral sides of both legs/ankles. I know she had a MRI of her back in 2020 but was thinking something else may be going on causing the nerve issues. Also with her joint pains I was going to do blood work to check for underlying arthritis but wanted to touch base with you first to see what your thoughts were. Thanks for your help!

## 2021-05-01 ENCOUNTER — Encounter: Payer: Self-pay | Admitting: Podiatry

## 2021-05-01 ENCOUNTER — Other Ambulatory Visit: Payer: Self-pay

## 2021-05-01 ENCOUNTER — Ambulatory Visit (INDEPENDENT_AMBULATORY_CARE_PROVIDER_SITE_OTHER): Payer: 59 | Admitting: Podiatry

## 2021-05-01 DIAGNOSIS — M2550 Pain in unspecified joint: Secondary | ICD-10-CM | POA: Diagnosis not present

## 2021-05-01 DIAGNOSIS — M958 Other specified acquired deformities of musculoskeletal system: Secondary | ICD-10-CM

## 2021-05-01 DIAGNOSIS — M19071 Primary osteoarthritis, right ankle and foot: Secondary | ICD-10-CM

## 2021-05-05 ENCOUNTER — Other Ambulatory Visit: Payer: Self-pay | Admitting: Osteopathic Medicine

## 2021-05-05 ENCOUNTER — Encounter: Payer: Self-pay | Admitting: Podiatry

## 2021-05-05 ENCOUNTER — Other Ambulatory Visit (HOSPITAL_COMMUNITY): Payer: Self-pay

## 2021-05-05 LAB — CBC WITH DIFFERENTIAL/PLATELET
Absolute Monocytes: 490 cells/uL (ref 200–950)
Basophils Absolute: 60 cells/uL (ref 0–200)
Basophils Relative: 0.7 %
Eosinophils Absolute: 232 cells/uL (ref 15–500)
Eosinophils Relative: 2.7 %
HCT: 42.5 % (ref 35.0–45.0)
Hemoglobin: 13.9 g/dL (ref 11.7–15.5)
Lymphs Abs: 3156 cells/uL (ref 850–3900)
MCH: 30.1 pg (ref 27.0–33.0)
MCHC: 32.7 g/dL (ref 32.0–36.0)
MCV: 92 fL (ref 80.0–100.0)
MPV: 11 fL (ref 7.5–12.5)
Monocytes Relative: 5.7 %
Neutro Abs: 4661 cells/uL (ref 1500–7800)
Neutrophils Relative %: 54.2 %
Platelets: 309 10*3/uL (ref 140–400)
RBC: 4.62 10*6/uL (ref 3.80–5.10)
RDW: 12.9 % (ref 11.0–15.0)
Total Lymphocyte: 36.7 %
WBC: 8.6 10*3/uL (ref 3.8–10.8)

## 2021-05-05 LAB — LUPUS(12) PANEL
Anti Nuclear Antibody (ANA): NEGATIVE
C3 Complement: 164 mg/dL (ref 83–193)
C4 Complement: 39 mg/dL (ref 15–57)
ENA SM Ab Ser-aCnc: <1 AI
Rheumatoid fact SerPl-aCnc: <14 IU/mL (ref <14)
Ribosomal P Protein Ab: <1 AI
SM/RNP: <1 AI
SSA (Ro) (ENA) Antibody, IgG: <1 AI
SSB (La) (ENA) Antibody, IgG: <1 AI
Scleroderma (Scl-70) (ENA) Antibody, IgG: <1 AI
Thyroperoxidase Ab SerPl-aCnc: 2 IU/mL (ref <9)
ds DNA Ab: 4 IU/mL

## 2021-05-05 LAB — COMPREHENSIVE METABOLIC PANEL
AG Ratio: 1.5 (calc) (ref 1.0–2.5)
ALT: 23 U/L (ref 6–29)
AST: 18 U/L (ref 10–30)
Albumin: 4.3 g/dL (ref 3.6–5.1)
Alkaline phosphatase (APISO): 67 U/L (ref 31–125)
BUN: 12 mg/dL (ref 7–25)
CO2: 26 mmol/L (ref 20–32)
Calcium: 9.8 mg/dL (ref 8.6–10.2)
Chloride: 106 mmol/L (ref 98–110)
Creat: 0.74 mg/dL (ref 0.50–0.99)
Globulin: 2.9 g/dL (calc) (ref 1.9–3.7)
Glucose, Bld: 78 mg/dL (ref 65–99)
Potassium: 4.3 mmol/L (ref 3.5–5.3)
Sodium: 140 mmol/L (ref 135–146)
Total Bilirubin: 0.4 mg/dL (ref 0.2–1.2)
Total Protein: 7.2 g/dL (ref 6.1–8.1)

## 2021-05-05 LAB — HLA-B27 ANTIGEN: HLA-B27 Antigen: NEGATIVE

## 2021-05-05 LAB — CYCLIC CITRUL PEPTIDE ANTIBODY, IGG: Cyclic Citrullin Peptide Ab: 33 UNITS — ABNORMAL HIGH

## 2021-05-05 LAB — SEDIMENTATION RATE: Sed Rate: 25 mm/h — ABNORMAL HIGH (ref 0–20)

## 2021-05-05 LAB — RHEUMATOID FACTOR (IGA, IGG, IGM)
Rheumatoid Factor (IgA): <5 U (ref <=6)
Rheumatoid Factor (IgG): <5 U (ref <=6)
Rheumatoid Factor (IgM): <5 U (ref <=6)

## 2021-05-05 LAB — CK: Total CK: 53 U/L (ref 29–143)

## 2021-05-05 LAB — URIC ACID: Uric Acid, Serum: 4.9 mg/dL (ref 2.5–7.0)

## 2021-05-05 MED ORDER — SERTRALINE HCL 25 MG PO TABS
25.0000 mg | ORAL_TABLET | Freq: Every day | ORAL | 0 refills | Status: DC
  Filled 2021-05-05: qty 15, 15d supply, fill #0

## 2021-05-05 NOTE — Addendum Note (Signed)
Addended by: Monica Becton on: 05/05/2021 11:55 AM   Modules accepted: Orders

## 2021-05-05 NOTE — Assessment & Plan Note (Signed)
Positive CCP, ESR, referral to rheumatology.

## 2021-05-06 NOTE — Progress Notes (Signed)
Subjective: 40 year old female presents the office today for follow-up evaluation of right ankle pain.  Also the note from the Medrol Dosepak and she states that while she was on the steroid she felt great.  Ankle pain resolved as well as overall she felt better in her other joints and she was actually even able to breathe better.  She has not had the blood work that was ordered by Dr. Benjamin Stain.  She still in the ankle brace which is helped some.  No recent injuries.   Objective: AAO x3, NAD DP/PT pulses palpable bilaterally, CRT less than 3 seconds There is mild tenderness to palpation globally around the right ankle.  There is tenderness on the medial malleolus along the medial and lateral ankle gutters.  There is no crepitation with range of motion of the ankle joint but there is some discomfort on the right side.  She also gets similar symptoms on the left side but not as severe.  She is also describing burning sensations and she points along the lateral aspect of the legs.  There are some mild edema to the ankles but there is no erythema or warmth.  MMT 5/5. Overall unchanged. No pain with calf compression, swelling, warmth, erythema  Assessment: Right ankle pain > left; neuritis  Plan: -All treatment options discussed with the patient including all alternatives, risks, complications.  -At this point I recommend her to get the blood work.  When she is on steroids all of her pain went away.  She thinks she had brought up that about 3 years ago she started having a lot of discomfort since she was on antidepressants.  She felt that when she is on the steroid she was feeling better and and overall her mood had much improved as well.  We will see what the blood work comes back as and then possibly refer to rheumatology. -MRI ordered as well given continued pain.  Vivi Barrack DPM

## 2021-05-08 ENCOUNTER — Telehealth: Payer: Self-pay

## 2021-05-08 ENCOUNTER — Telehealth: Payer: Self-pay | Admitting: *Deleted

## 2021-05-08 DIAGNOSIS — M2550 Pain in unspecified joint: Secondary | ICD-10-CM

## 2021-05-08 NOTE — Telephone Encounter (Signed)
Referral changed

## 2021-05-08 NOTE — Telephone Encounter (Signed)
Called and spoke with Orlando Penner from [facility] and the procedure code [id number] does not need a prior authorization and as long as the [facility] to a [facility] it will pay 80% and the patient has met the family deductible of $3,000.00 and I spoke with [facility]) today and she is going to call patient and see if she can do the MRI at 2:30 or 5:30 tomorrow. Lattie Haw

## 2021-05-08 NOTE — Telephone Encounter (Signed)
Patient aware new referral placed.

## 2021-05-08 NOTE — Telephone Encounter (Signed)
Patient called to report that the referral was out of network for her insurance. She has [employer] and she states it needs to be in the [employer].

## 2021-05-12 ENCOUNTER — Other Ambulatory Visit: Payer: Self-pay | Admitting: Podiatry

## 2021-05-12 MED ORDER — CELECOXIB 100 MG PO CAPS
100.0000 mg | ORAL_CAPSULE | Freq: Two times a day (BID) | ORAL | 0 refills | Status: DC
  Filled 2021-05-12: qty 60, 30d supply, fill #0

## 2021-05-12 NOTE — Progress Notes (Signed)
[employer] ordered.

## 2021-05-13 ENCOUNTER — Other Ambulatory Visit (HOSPITAL_COMMUNITY): Payer: Self-pay
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
## 2021-05-15 ENCOUNTER — Other Ambulatory Visit (HOSPITAL_COMMUNITY): Payer: Self-pay

## 2021-05-25 ENCOUNTER — Ambulatory Visit (HOSPITAL_COMMUNITY)
Admission: RE | Admit: 2021-05-25 | Discharge: 2021-05-25 | Disposition: A | Discharge status: Home / Self Care | Payer: 59 | Source: Ambulatory Visit | Attending: Rheumatology | Admitting: Rheumatology

## 2021-05-25 ENCOUNTER — Ambulatory Visit: Payer: Self-pay

## 2021-05-25 ENCOUNTER — Other Ambulatory Visit: Payer: Self-pay

## 2021-05-25 ENCOUNTER — Encounter: Payer: Self-pay | Admitting: Rheumatology

## 2021-05-25 ENCOUNTER — Ambulatory Visit (INDEPENDENT_AMBULATORY_CARE_PROVIDER_SITE_OTHER): Payer: 59 | Admitting: Rheumatology

## 2021-05-25 VITALS — BP 159/95 | HR 92 | Ht 64.0 in | Wt 211.4 lb

## 2021-05-25 DIAGNOSIS — M79671 Pain in right foot: Secondary | ICD-10-CM

## 2021-05-25 DIAGNOSIS — F411 Generalized anxiety disorder: Secondary | ICD-10-CM

## 2021-05-25 DIAGNOSIS — M542 Cervicalgia: Secondary | ICD-10-CM | POA: Diagnosis not present

## 2021-05-25 DIAGNOSIS — Z5181 Encounter for therapeutic drug level monitoring: Secondary | ICD-10-CM | POA: Diagnosis not present

## 2021-05-25 DIAGNOSIS — M958 Other specified acquired deformities of musculoskeletal system: Secondary | ICD-10-CM | POA: Diagnosis not present

## 2021-05-25 DIAGNOSIS — M79672 Pain in left foot: Secondary | ICD-10-CM | POA: Diagnosis not present

## 2021-05-25 DIAGNOSIS — M1990 Unspecified osteoarthritis, unspecified site: Secondary | ICD-10-CM

## 2021-05-25 DIAGNOSIS — M5136 Other intervertebral disc degeneration, lumbar region: Secondary | ICD-10-CM | POA: Diagnosis not present

## 2021-05-25 DIAGNOSIS — Z79899 Other long term (current) drug therapy: Secondary | ICD-10-CM | POA: Insufficient documentation

## 2021-05-25 DIAGNOSIS — M25579 Pain in unspecified ankle and joints of unspecified foot: Secondary | ICD-10-CM | POA: Diagnosis not present

## 2021-05-25 DIAGNOSIS — M249 Joint derangement, unspecified: Secondary | ICD-10-CM | POA: Diagnosis not present

## 2021-05-25 DIAGNOSIS — M2550 Pain in unspecified joint: Secondary | ICD-10-CM

## 2021-05-25 DIAGNOSIS — K8020 Calculus of gallbladder without cholecystitis without obstruction: Secondary | ICD-10-CM

## 2021-05-25 DIAGNOSIS — M51369 Other intervertebral disc degeneration, lumbar region without mention of lumbar back pain or lower extremity pain: Secondary | ICD-10-CM

## 2021-05-25 DIAGNOSIS — R768 Other specified abnormal immunological findings in serum: Secondary | ICD-10-CM

## 2021-05-25 DIAGNOSIS — Z3041 Encounter for surveillance of contraceptive pills: Secondary | ICD-10-CM

## 2021-05-25 DIAGNOSIS — I10 Essential (primary) hypertension: Secondary | ICD-10-CM

## 2021-05-25 DIAGNOSIS — M5416 Radiculopathy, lumbar region: Secondary | ICD-10-CM

## 2021-05-25 NOTE — Progress Notes (Signed)
Pharmacy Note  Subjective: Patient presents today to [facility] for follow up office visit. Patient seen by the pharmacist for counseling on methotrexate for inflammatory arthritis.  Naive to DMARDs.  Objective: CBC    Component Value Date/Time   WBC 8.6 05/01/2021 0000   RBC 4.62 05/01/2021 0000   HGB 13.9 05/01/2021 0000   HCT 42.5 05/01/2021 0000   PLT 309 05/01/2021 0000   MCV 92.0 05/01/2021 0000   MCH 30.1 05/01/2021 0000   MCHC 32.7 05/01/2021 0000   RDW 12.9 05/01/2021 0000   LYMPHSABS 3,156 05/01/2021 0000   EOSABS 232 05/01/2021 0000   BASOSABS 60 05/01/2021 0000    CMP     Component Value Date/Time   NA 140 05/01/2021 0000   K 4.3 05/01/2021 0000   CL 106 05/01/2021 0000   CO2 26 05/01/2021 0000   GLUCOSE 78 05/01/2021 0000   BUN 12 05/01/2021 0000   CREATININE 0.74 05/01/2021 0000   CALCIUM 9.8 05/01/2021 0000   PROT 7.2 05/01/2021 0000   AST 18 05/01/2021 0000   ALT 23 05/01/2021 0000   BILITOT 0.4 05/01/2021 0000   GFRNONAA 100 04/19/2019 1032   GFRAA 115 04/19/2019 1032    Baseline Immunosuppressant Therapy Labs TB GOLD   Hepatitis Panel   HIV No results found for: HIV Immunoglobulins Immunoglobulin Electrophoresis Latest Ref Rng & Units 07/18/2019  IgA  47 - 310 mg/dL 419   SPEP Serum Protein Electrophoresis Latest Ref Rng & Units 05/01/2021  Total Protein 6.1 - 8.1 g/dL 7.2   F7TK No results found for: G6PDH TPMT No results found for: TPMT   Chest-xray:  ordered today  Contraception: uses OCPs  Alcohol use: none  Assessment/Plan:   Patient was counseled on the purpose, proper use, and adverse effects of methotrexate including nausea, infection, and signs and symptoms of pneumonitis. Discussed that there is the possibility of an increased risk of malignancy, specifically lymphomas, but it is not well understood if this increased risk is due to the medication or the disease state.  Instructed patient that medication  should be held for infection and prior to surgery.  Advised patient to avoid live vaccines. Recommend annual influenza, Pneumovax 23, Prevnar 13, and Shingrix as indicated.   Reviewed instructions with patient to take methotrexate weekly along with folic acid daily.  Discussed the importance of frequent monitoring of kidney and liver function and blood counts, and provided patient with standing lab instructions.  Counseled patient to avoid NSAIDs and alcohol while on methotrexate.  Provided patient with educational materials on methotrexate and answered all questions.   Patient voiced understanding.  Patient consented to methotrexate use.  Will upload into chart.    Dose of methotrexate will be 15mg  weekly along with folic acid 2 daily x 2 weeks. Will recheck labs in 2 weeks. If labs are stable, will increase dose to 20mg  weekly then recheck labs in 2 weeks and every 3 months routinely thereafter. Prescription pending lab results.  , PharmD, MPH, BCPS Clinical Pharmacist (Rheumatology and Pulmonology)

## 2021-05-25 NOTE — Patient Instructions (Addendum)
Please have chest x-ray completed at the hospital. You don't need an appointment. Simply go to the front desk at [facility] and they will direct you to the radiologist.  We will call you with lab results and dosing of methotrexate pending these results.  Standing Labs We placed an order today for your standing lab work.   Please have your standing labs drawn in 2 weeks, again in 2 weeks, then every 3 months  If possible, please have your labs drawn 2 weeks prior to your appointment so that the provider can discuss your results at your appointment.  Please note that you may see your imaging and lab results in MyChart before we have reviewed them. We may be awaiting multiple results to interpret others before contacting you. Please allow our office up to 72 hours to thoroughly review all of the results before contacting the office for clarification of your results.  We have open lab daily: Monday through Thursday from 1:30-4:30 PM and Friday from 1:30-4:00 PM at the office of Dr. Pollyann Savoy, [facility].   Please be advised, all patients with office appointments requiring lab work will take precedent over walk-in lab work.  If possible, please come for your lab work on Monday and Friday afternoons, as you may experience shorter wait times. The office is located at [address], [place], [place] [address] No appointment is necessary.   Labs are drawn by [facility]. Please bring your co-pay at the time of your lab draw.  You may receive a bill from [facility] for your lab work.  If you wish to have your labs drawn at another location, please call the office 24 hours in advance to send orders.  If you have any questions regarding directions or hours of operation,  please call [phone].   As a reminder, please drink plenty of water prior to coming for your lab work. Thanks!  Vaccines You are taking a medication(s) that can suppress your immune system.  The  following immunizations are recommended: Flu annually Covid-19  Td/Tdap (tetanus, diphtheria, pertussis) every 10 years Pneumonia (Prevnar 15 then Pneumovax 23 at least 1 year apart.  Alternatively, can take Prevnar 20 without needing additional dose) Shingrix: 2 doses from 4 weeks to 6 months apart  Please check with your PCP to make sure you are up to date.  Methotrexate Tablets What is this medication? METHOTREXATE (METH oh TREX ate) treats inflammatory conditions such as arthritis and psoriasis. It works by decreasing inflammation, which can reduce pain and prevent long-term injury to the joints and skin. It may also be used to treat some types of cancer. It works by slowing down the growth of cancer cells. This medicine may be used for other purposes; ask your health care provider or pharmacist if you have questions. COMMON BRAND NAME(S): Rheumatrex, Trexall What should I tell my care team before I take this medication? They need to know if you have any of these conditions: Fluid in the stomach area or lungs If you often drink alcohol Infection or immune system problems Kidney disease or on hemodialysis Liver disease Low blood counts, like low white cell, platelet, or red cell counts Lung disease Radiation therapy Stomach ulcers Ulcerative colitis An unusual or allergic reaction to methotrexate, other medications, foods, dyes, or preservatives Pregnant or trying to get pregnant Breast-feeding How should I use this medication? Take this medication by mouth with a glass of water. Follow the directions on the prescription label. Take  your medication at regular intervals. Do not take it more often than directed. Do not stop taking except on your care team's advice. Make sure you know why you are taking this medication and how often you should take it. If this medication is used for a condition that is not cancer, like arthritis or psoriasis, it should be taken weekly, NOT daily.  Taking this medication more often than directed can cause serious side effects, even death. Talk to your care team about safe handling and disposal of this medication. You may need to take special precautions. Talk to your care team about the use of this medication in children. While this medication may be prescribed for selected conditions, precautions do apply. Overdosage: If you think you have taken too much of this medicine contact a poison control center or emergency room at once. NOTE: This medicine is only for you. Do not share this medicine with others. What if I miss a dose? If you miss a dose, talk with your care team. Do not take double or extra doses. What may interact with this medication? Do not take this medication with any of the following: Acitretin This medication may also interact with the following: Aspirin and aspirin-like medications including salicylates Azathioprine Certain antibiotics like penicillins, tetracycline, and chloramphenicol Certain medications that treat or prevent blood clots like warfarin, apixaban, dabigatran, and rivaroxaban Certain medications for stomach problems like esomeprazole, omeprazole, pantoprazole Cyclosporine Dapsone Diuretics Gold Hydroxychloroquine Live virus vaccines Medications for infection like acyclovir, adefovir, amphotericin B, bacitracin, cidofovir, foscarnet, ganciclovir, gentamicin, pentamidine, vancomycin Mercaptopurine NSAIDs, medications for pain and inflammation, like ibuprofen or naproxen Other cytotoxic agents Pamidronate Pemetrexed Penicillamine Phenylbutazone Phenytoin Probenecid Pyrimethamine Retinoids such as isotretinoin and tretinoin Steroid medications like prednisone or cortisone Sulfonamides like sulfasalazine and trimethoprim/sulfamethoxazole Theophylline Zoledronic acid This list may not describe all possible interactions. Give your health care provider a list of all the medicines, herbs,  non-prescription drugs, or dietary supplements you use. Also tell them if you smoke, drink alcohol, or use illegal drugs. Some items may interact with your medicine. What should I watch for while using this medication? Avoid alcoholic drinks. This medication can make you more sensitive to the sun. Keep out of the sun. If you cannot avoid being in the sun, wear protective clothing and use sunscreen. Do not use sun lamps or tanning beds/booths. You may need blood work done while you are taking this medication. Call your care team for advice if you get a fever, chills or sore throat, or other symptoms of a cold or flu. Do not treat yourself. This medication decreases your body's ability to fight infections. Try to avoid being around people who are sick. This medication may increase your risk to bruise or bleed. Call your care team if you notice any unusual bleeding. Be careful brushing or flossing your teeth or using a toothpick because you may get an infection or bleed more easily. If you have any dental work done, tell your dentist you are receiving this medication. Check with your care team if you get an attack of severe diarrhea, nausea and vomiting, or if you sweat a lot. The loss of too much body fluid can make it dangerous for you to take this medication. Talk to your care team about your risk of cancer. You may be more at risk for certain types of cancers if you take this medication. Do not become pregnant while taking this medication or for 6 months after stopping it. Women should inform  their care team if they wish to become pregnant or think they might be pregnant. Men should not father a child while taking this medication and for 3 months after stopping it. There is potential for serious harm to an unborn child. Talk to your care team for more information. Do not breast-feed an infant while taking this medication or for 1 week after stopping it. This medication may make it more difficult to get  pregnant or father a child. Talk to your care team if you are concerned about your fertility. What side effects may I notice from receiving this medication? Side effects that you should report to your care team as soon as possible: Allergic reactions-skin rash, itching, hives, swelling of the face, lips, tongue, or throat Blood clot-pain, swelling, or warmth in the leg, shortness of breath, chest pain Dry cough, shortness of breath or trouble breathing Infection-fever, chills, cough, sore throat, wounds that don't heal, pain or trouble when passing urine, general feeling of discomfort or being unwell Kidney injury-decrease in the amount of urine, swelling of the ankles, hands, or feet Liver injury-right upper belly pain, loss of appetite, nausea, light-colored stool, dark yellow or brown urine, yellowing of the skin or eyes, unusual weakness or fatigue Low red blood cell count-unusual weakness or fatigue, dizziness, headache, trouble breathing Redness, blistering, peeling, or loosening of the skin, including inside the mouth Seizures Unusual bruising or bleeding Side effects that usually do not require medical attention (report to your care team if they continue or are bothersome): Diarrhea Dizziness Hair loss Nausea Pain, redness, or swelling with sores inside the mouth or throat Vomiting This list may not describe all possible side effects. Call your doctor for medical advice about side effects. You may report side effects to FDA at 1-800-FDA-1088. Where should I keep my medication? Keep out of the reach of children and pets. Store at room temperature between 20 and 25 degrees C (68 and 77 degrees F). Protect from light. Get rid of any unused medication after the expiration date. Talk to your care team about how to dispose of unused medication. Special directions may apply. NOTE: This sheet is a summary. It may not cover all possible information. If you have questions about this medicine,  talk to your doctor, pharmacist, or health care provider.  2022 Elsevier/Gold Standard (2020-09-29 15:55:45)

## 2021-05-26 NOTE — Progress Notes (Signed)
CXR is normal

## 2021-06-01 ENCOUNTER — Encounter: Payer: Self-pay | Admitting: Rheumatology

## 2021-06-01 DIAGNOSIS — Z79899 Other long term (current) drug therapy: Secondary | ICD-10-CM

## 2021-06-01 DIAGNOSIS — M1990 Unspecified osteoarthritis, unspecified site: Secondary | ICD-10-CM

## 2021-06-02 ENCOUNTER — Other Ambulatory Visit (HOSPITAL_COMMUNITY): Payer: Self-pay

## 2021-06-02 LAB — QUANTIFERON-TB GOLD PLUS
Mitogen-NIL: >10 IU/mL
NIL: 0.02 IU/mL
QuantiFERON-TB Gold Plus: NEGATIVE
TB1-NIL: 0 IU/mL
TB2-NIL: 0 IU/mL

## 2021-06-02 LAB — IGG, IGA, IGM
IgG (Immunoglobin G), Serum: 1114 mg/dL (ref 600–1640)
IgM, Serum: 157 mg/dL (ref 50–300)
Immunoglobulin A: 214 mg/dL (ref 47–310)

## 2021-06-02 LAB — HIV ANTIBODY (ROUTINE TESTING W REFLEX): HIV 1&2 Ab, 4th Generation: NONREACTIVE

## 2021-06-02 LAB — HEPATITIS C ANTIBODY
Hepatitis C Ab: NONREACTIVE
SIGNAL TO CUT-OFF: 0.01 (ref <1.00)

## 2021-06-02 LAB — PROTEIN ELECTROPHORESIS, SERUM, WITH REFLEX
Albumin ELP: 4.1 g/dL (ref 3.8–4.8)
Alpha 1: 0.3 g/dL (ref 0.2–0.3)
Alpha 2: 0.9 g/dL (ref 0.5–0.9)
Beta 2: 0.3 g/dL (ref 0.2–0.5)
Beta Globulin: 0.5 g/dL (ref 0.4–0.6)
Gamma Globulin: 1 g/dL (ref 0.8–1.7)
Total Protein: 7.2 g/dL (ref 6.1–8.1)

## 2021-06-02 LAB — 14-3-3 ETA PROTEIN: 14-3-3 eta Protein: <0.2 ng/mL (ref <0.2)

## 2021-06-02 LAB — ANGIOTENSIN CONVERTING ENZYME: Angiotensin-Converting Enzyme: 22 U/L (ref 9–67)

## 2021-06-02 LAB — HEPATITIS B CORE ANTIBODY, IGM: Hep B C IgM: NONREACTIVE

## 2021-06-02 LAB — HEPATITIS B SURFACE ANTIGEN: Hepatitis B Surface Ag: NONREACTIVE

## 2021-06-02 MED ORDER — METHOTREXATE 2.5 MG PO TABS
ORAL_TABLET | ORAL | 0 refills | Status: AC
  Filled 2021-06-02: qty 28, 28d supply, fill #0

## 2021-06-02 MED ORDER — FOLIC ACID 1 MG PO TABS
2.0000 mg | ORAL_TABLET | Freq: Every day | ORAL | 3 refills | Status: AC
  Filled 2021-06-02: qty 180, 90d supply, fill #0
  Filled 2021-09-04: qty 180, 90d supply, fill #1
  Filled 2021-12-23: qty 180, 90d supply, fill #2
  Filled 2022-03-15: qty 180, 90d supply, fill #3

## 2021-06-02 NOTE — Telephone Encounter (Signed)
Per lab note: All the labs are within normal limits.  Chest x-ray was normal.  It is okay to discharge from methotrexate 6 tablets p.o.weekly and folic acid 2 mg p.o. daily.  If labs are normal we will increase methotrexate to 8 tablets p.o. weekly after 2 weeks.  Standing orders as described in the office.

## 2021-06-02 NOTE — Progress Notes (Signed)
All the labs are within normal limits.  Chest x-ray was normal.  It is okay to discharge from methotrexate 6 tablets p.o.weekly and folic acid 2 mg p.o. daily.  If labs are normal we will increase methotrexate to 8 tablets p.o. weekly after 2 weeks.  Standing orders as described in the office.

## 2021-06-05 ENCOUNTER — Encounter: Payer: 59 | Admitting: Family Medicine

## 2021-06-05 ENCOUNTER — Other Ambulatory Visit (HOSPITAL_COMMUNITY): Payer: Self-pay

## 2021-06-09 ENCOUNTER — Encounter: Payer: Self-pay | Admitting: Podiatry

## 2021-06-09 ENCOUNTER — Ambulatory Visit (INDEPENDENT_AMBULATORY_CARE_PROVIDER_SITE_OTHER): Payer: 59 | Admitting: Podiatry

## 2021-06-09 ENCOUNTER — Other Ambulatory Visit (HOSPITAL_COMMUNITY): Payer: Self-pay

## 2021-06-09 ENCOUNTER — Other Ambulatory Visit: Payer: Self-pay

## 2021-06-09 DIAGNOSIS — M25571 Pain in right ankle and joints of right foot: Secondary | ICD-10-CM | POA: Diagnosis not present

## 2021-06-09 DIAGNOSIS — M19071 Primary osteoarthritis, right ankle and foot: Secondary | ICD-10-CM | POA: Diagnosis not present

## 2021-06-09 DIAGNOSIS — G8929 Other chronic pain: Secondary | ICD-10-CM

## 2021-06-09 DIAGNOSIS — M7751 Other enthesopathy of right foot: Secondary | ICD-10-CM

## 2021-06-09 MED ORDER — TRAMADOL HCL 50 MG PO TABS
50.0000 mg | ORAL_TABLET | Freq: Three times a day (TID) | ORAL | 0 refills | Status: AC | PRN
  Filled 2021-06-09: qty 10, 4d supply, fill #0

## 2021-06-09 MED ORDER — TRIAMCINOLONE ACETONIDE 10 MG/ML IJ SUSP
10.0000 mg | Freq: Once | INTRAMUSCULAR | Status: AC
  Administered 2021-06-09: 10 mg

## 2021-06-09 NOTE — Patient Instructions (Signed)

## 2021-06-09 NOTE — Progress Notes (Signed)
Subjective: 40 year old female presents the office today for concerns of right ankle, foot pain.  She states that she has been significantly uncomfortable and having pain to the right ankle and foot.  Since last saw her she has follow-up with rheumatology and she is been started on methotrexate which she just started this earlier in the week.  No recent injury or falls.  She states that she has to wear the brace at work that time she is too uncomfortable she is not able to stand or walk.  Objective: AAO x3, NAD DP/PT pulses palpable bilaterally, CRT less than 3 seconds There is global tenderness to the right ankle.  Majority tenderness is on the anteromedial, anterior lateral aspect ankle joint.  Mildly to the sinus tarsi.  There is also mild discomfort in the Achilles tendon, plantar fascial although the tendons appear to be intact today.  Minimal edema to the ankle there is no erythema or warmth.  No crepitation with ankle or subtalar joint range of motion there is no restriction with range of motion.  MMT 5/5.  No pain with calf compression, swelling, warmth, erythema  Assessment: Capsulitis right ankle, arthritis  Plan: -All treatment options discussed with the patient including all alternatives, risks, complications.  -She is followed with rheumatology and she just started methotrexate, folic acid this week.  We will need to see over the next several weeks how she responds to this medication. -Steroid injection to the ankle performed today.  Skin was prepped with alcohol, Betadine.  Mixture of 1 cc Kenalog 10, 0.5 cc of Marcaine plain, 0.5 cc of lidocaine plain was infiltrated to the anterior medial aspect of the ankle joint without complications.  Postinjection care discussed. -Continue with ankle brace for now.  She has a cam boot as well that she is to wear for next couple of days.  She states that typically after injection she gets significant pain.  I did prescribe tramadol for her as well.   Continue to ice and elevate. -Consider PT -I will see her back in 6 weeks or sooner if any issues or changes were to arise. -Patient encouraged to call the office with any questions, concerns, change in symptoms.   Vivi Barrack DPM

## 2021-06-12 ENCOUNTER — Ambulatory Visit: Payer: 59 | Admitting: Podiatry

## 2021-06-12 NOTE — Progress Notes (Signed)
Office Visit Note  Patient: Rachel Levy             Date of Birth: 03/03/1981           MRN: [id number]             PCP: Emeterio Reeve, DO Referring: Emeterio Reeve, DO Visit Date: 06/24/2021 Occupation: _0 @  Subjective:  Medication Management (Patient is tolerating methotrexate well with no side effects. )   History of Present Illness: Rachel Levy is a 40 y.o. female with sero negative rheumatoid arthritis and positive CCP.  She has been having recurrent pain and swelling in her ankle joints.  She had surgery on her right ankle joint with ongoing discomfort.  She states since she started taking methotrexate 2 weeks ago she had an episode of increased pain and discomfort in her right ankle joint to the point that she had to have another cortisone injection in her right ankle joint.  She had good response to the cortisone injection.  She continues to have discomfort in her bilateral ankle joints.  She also has pain and discomfort in her right wrist joint.  She has been tolerating methotrexate without any side effects.  She has not noticed any improvement on methotrexate so far.  Activities of Daily Living:  Patient reports morning stiffness for 30-45 minutes.   Patient Reports nocturnal pain.  Difficulty dressing/grooming: Denies Difficulty climbing stairs: Denies Difficulty getting out of chair: Denies Difficulty using hands for taps, buttons, cutlery, and/or writing: Reports  Review of Systems  Constitutional:  Positive for fatigue.  HENT:  Negative for mouth sores, mouth dryness and nose dryness.   Eyes:  Negative for pain, itching and dryness.  Respiratory:  Negative for shortness of breath and difficulty breathing.   Cardiovascular:  Negative for chest pain and palpitations.  Gastrointestinal:  Negative for blood in stool, constipation and diarrhea.  Endocrine: Negative for increased urination.  Genitourinary:  Negative for difficulty urinating.   Musculoskeletal:  Positive for joint pain, joint pain, myalgias, morning stiffness, muscle tenderness and myalgias. Negative for joint swelling.  Skin:  Negative for color change, rash and redness.  Allergic/Immunologic: Positive for susceptible to infections.  Neurological:  Negative for dizziness, numbness, headaches, memory loss and weakness.  Hematological:  Positive for bruising/bleeding tendency.  Psychiatric/Behavioral:  Negative for confusion.    PMFS History:  Patient Active Problem List   Diagnosis Date Noted   Polyarthralgia 04/13/2021   Acute maxillary sinusitis 05/20/2020   Calculus of gallbladder without cholecystitis without obstruction 12/13/2019   Oral contraceptive pill surveillance 04/19/2019   Epiphora, right 04/19/2019   Deviated nasal septum 04/19/2019   Hypertension goal BP (blood pressure) < 130/80 03/15/2019   GAD (generalized anxiety disorder) 02/08/2019   Encounter for initial prescription of contraceptive pills 02/08/2019   Elevated blood pressure reading 02/08/2019   Osteochondral defect of talus 10/01/2018   Right lumbar radiculitis 10/01/2018   Phlebolith 09/29/2018   Breakthrough bleeding associated with intrauterine device (IUD) 11/17/2017    Past Medical History:  Diagnosis Date   Anxiety     Family History  Problem Relation Age of Onset   Hyperlipidemia Mother    Arthritis Mother    Diabetes Father    Healthy Sister    Hyperlipidemia Maternal Aunt    Hyperlipidemia Maternal Uncle    Diabetes Maternal Grandfather    Past Surgical History:  Procedure Laterality Date   ANKLE SURGERY Right    CHOLECYSTECTOMY     NASAL  SEPTUM SURGERY     Social History   Social History Narrative   Not on file   Immunization History  Administered Date(s) Administered   Influenza,inj,Quad PF,6+ Mos 10/20/2017, 09/27/2018, 07/18/2019, 06/17/2020   PFIZER(Purple Top)SARS-COV-2 Vaccination 12/07/2019, 12/28/2019   Tdap 09/06/2014      Objective: Vital Signs: BP (!) 137/92 (BP Location: Left Arm, Patient Position: Sitting, Cuff Size: Normal)   Pulse 76   Ht 5' 5" (1.651 m)   Wt 213 lb (96.6 kg)   BMI 35.45 kg/m    Physical Exam Vitals and nursing note reviewed.  Constitutional:      Appearance: She is well-developed.  HENT:     Head: Normocephalic and atraumatic.  Eyes:     Conjunctiva/sclera: Conjunctivae normal.  Cardiovascular:     Rate and Rhythm: Normal rate and regular rhythm.     Heart sounds: Normal heart sounds.  Pulmonary:     Effort: Pulmonary effort is normal.     Breath sounds: Normal breath sounds.  Abdominal:     General: Bowel sounds are normal.     Palpations: Abdomen is soft.  Musculoskeletal:     Cervical back: Normal range of motion.  Lymphadenopathy:     Cervical: No cervical adenopathy.  Skin:    General: Skin is warm and dry.     Capillary Refill: Capillary refill takes less than 2 seconds.  Neurological:     Mental Status: She is alert and oriented to person, place, and time.  Psychiatric:        Behavior: Behavior normal.     Musculoskeletal Exam: C-spine was in good range of motion.  Shoulder joints, elbow joints, wrist joints, MCPs PIPs and DIPs with good range of motion.  She tenderness on palpation of the right wrist joint.  Hip joints and knee joints in good range of motion.  She tenderness on palpation of her right ankle with warmth on palpation.  She also had tenderness on her left ankle.  There was no tenderness over MTPs.  CDAI Exam: CDAI Score: 2  Patient Global: 5 mm; Provider Global: 5 mm Swollen: 0 ; Tender: 4  Joint Exam 06/24/2021      Right  Left  Wrist   Tender     Lumbar Spine   Tender     Ankle   Tender   Tender     Investigation: No additional findings.  Imaging: No results found.  Recent Labs: Lab Results  Component Value Date   WBC 8.6 05/01/2021   HGB 13.9 05/01/2021   PLT 309 05/01/2021   NA 140 05/01/2021   K 4.3 05/01/2021   CL  106 05/01/2021   CO2 26 05/01/2021   GLUCOSE 78 05/01/2021   BUN 12 05/01/2021   CREATININE 0.74 05/01/2021   BILITOT 0.4 05/01/2021   AST 18 05/01/2021   ALT 23 05/01/2021   PROT 7.2 05/25/2021   CALCIUM 9.8 05/01/2021   GFRAA 115 04/19/2019   QFTBGOLDPLUS NEGATIVE 05/25/2021   May 25, 2021 SPEP normal, immunoglobulins normal, TB Gold negative, hepatitis B-, hepatitis C negative, HIV negative, ACE 23, _0 eta negative  05/01/21: anti-CCP 33, HLA-B27-, ESR 25, uric acid 4.9, CK 53, RF-, Scl-70-, complements WNL, Ro-, La-, SM/RNP-, SM ab-, Ribosomal P protein-, dsDNA 4, ANA-.  All autoimmune work-up is negative.   Speciality Comments: No specialty comments available.  Procedures:  No procedures performed Allergies: Penicillins, Doxycycline, Mircette [desogestrel-ethinyl estradiol], and Nuvaring [etonogestrel-ethinyl estradiol]   Assessment / Plan:  Visit Diagnoses: Rheumatoid arthritis of multiple sites with negative rheumatoid factor ([place]) - S/p surgery right ankle.  Pain and swelling in bilateral ankle joints.  She is followed by Dr. Jacqualyn Posey.  She has been on methotrexate for last 2 weeks.  She has not experienced any side effects and has not noticed any improvement in her symptoms.  She continues to have pain and discomfort in her right wrist, lower back and bilateral ankles.  She had recent cortisone injection to her right ankle joint due to severe pain and discomfort.  She states that her right ankle is feeling better after the injection.  I advised her to increase methotrexate to 8 tablets p.o. weekly.  We will check labs today.  I also discussed starting her on subcutaneous methotrexate.  We will apply for subcutaneous methotrexate.  I advised her to return for follow-up visit in 6 weeks.  If she has an adequate response to methotrexate we will add anti-TNF's.  Positive anti-CCP test - May 01, 2021 Anti-CCP 33-weak positive  High risk medication use - Methotrexate 6  tablets p.o. weekly, folic acid 2 mg p.o. daily.  She is on oral contraceptive pills  - Plan: CBC with Differential/Platelet, COMPLETE METABOLIC PANEL WITH GFR today, 2 weeks and then every 3 months.  She was advised to stop methotrexate in case she develops an infection and resume after the infection resolves.  Pain in joint involving ankle and foot, unspecified laterality-she has ongoing pain and discomfort in her bilateral ankles.  There was found on palpation of her right ankle joint.  Osteochondral defect of talus - S/p surgery by Dr. Jacqualyn Posey  Neck pain - History of DDD based on MRI findings per patient.  DDD (degenerative disc disease), lumbar - With intermittent right sided radiculitis.  She continues to have lower back pain.  Hypermobility of joint  Hypertension goal BP (blood pressure) < 130/80  GAD (generalized anxiety disorder)  Orders: Orders Placed This Encounter  Procedures   CBC with Differential/Platelet   COMPLETE METABOLIC PANEL WITH GFR    No orders of the defined types were placed in this encounter.    Follow-Up Instructions: Return in about 6 weeks (around 08/05/2021) for Rheumatoid arthritis.   Bo Merino, MD  Note - This record has been created using Editor, commissioning.  Chart creation errors have been sought, but may not always  have been located. Such creation errors do not reflect on  the standard of medical care.

## 2021-06-24 ENCOUNTER — Telehealth: Payer: Self-pay

## 2021-06-24 ENCOUNTER — Encounter: Payer: Self-pay | Admitting: Rheumatology

## 2021-06-24 ENCOUNTER — Other Ambulatory Visit: Payer: Self-pay

## 2021-06-24 ENCOUNTER — Ambulatory Visit (INDEPENDENT_AMBULATORY_CARE_PROVIDER_SITE_OTHER): Payer: 59 | Admitting: Rheumatology

## 2021-06-24 VITALS — BP 137/92 | HR 76 | Ht 65.0 in | Wt 213.0 lb

## 2021-06-24 DIAGNOSIS — I10 Essential (primary) hypertension: Secondary | ICD-10-CM

## 2021-06-24 DIAGNOSIS — F411 Generalized anxiety disorder: Secondary | ICD-10-CM

## 2021-06-24 DIAGNOSIS — M25579 Pain in unspecified ankle and joints of unspecified foot: Secondary | ICD-10-CM

## 2021-06-24 DIAGNOSIS — M5136 Other intervertebral disc degeneration, lumbar region: Secondary | ICD-10-CM | POA: Diagnosis not present

## 2021-06-24 DIAGNOSIS — M542 Cervicalgia: Secondary | ICD-10-CM

## 2021-06-24 DIAGNOSIS — M0609 Rheumatoid arthritis without rheumatoid factor, multiple sites: Secondary | ICD-10-CM

## 2021-06-24 DIAGNOSIS — M249 Joint derangement, unspecified: Secondary | ICD-10-CM

## 2021-06-24 DIAGNOSIS — M958 Other specified acquired deformities of musculoskeletal system: Secondary | ICD-10-CM

## 2021-06-24 DIAGNOSIS — R768 Other specified abnormal immunological findings in serum: Secondary | ICD-10-CM

## 2021-06-24 DIAGNOSIS — Z79899 Other long term (current) drug therapy: Secondary | ICD-10-CM | POA: Diagnosis not present

## 2021-06-24 DIAGNOSIS — M1990 Unspecified osteoarthritis, unspecified site: Secondary | ICD-10-CM

## 2021-06-24 NOTE — Telephone Encounter (Signed)
Please apply for Rasuvo 20mg  per Dr. . Patient is currently on methotrexate tablets and will be increasing to 8 tablets once weekly and return for labs in 2 weeks.   Thanks!

## 2021-06-24 NOTE — Patient Instructions (Addendum)
Please increase methotrexate to 8 tablets by mouth once a week.  Once methotrexate injections are approved then we will replace methotrexate tablets to injectable methotrexate.  Standing Labs We placed an order today for your standing lab work.   Please have your standing labs drawn in 2 weeks after increased dose of methotrexate and then every 3 months  If possible, please have your labs drawn 2 weeks prior to your appointment so that the provider can discuss your results at your appointment.  Please note that you may see your imaging and lab results in MyChart before we have reviewed them. We may be awaiting multiple results to interpret others before contacting you. Please allow our office up to 72 hours to thoroughly review all of the results before contacting the office for clarification of your results.  We have open lab daily: Monday through Thursday from 1:30-4:30 PM and Friday from 1:30-4:00 PM at the office of Dr. Pollyann Savoy, [facility].   Please be advised, all patients with office appointments requiring lab work will take precedent over walk-in lab work.  If possible, please come for your lab work on Monday and Friday afternoons, as you may experience shorter wait times. The office is located at [address], [address], [place], [place] [address] No appointment is necessary.   Labs are drawn by [facility]. Please bring your co-pay at the time of your lab draw.  You may receive a bill from [facility] for your lab work.  If you wish to have your labs drawn at another location, please call the office 24 hours in advance to send orders.  If you have any questions regarding directions or hours of operation,  please call [phone].   As a reminder, please drink plenty of water prior to coming for your lab work. Thanks!

## 2021-06-25 ENCOUNTER — Other Ambulatory Visit (HOSPITAL_COMMUNITY): Payer: Self-pay

## 2021-06-25 LAB — CBC WITH DIFFERENTIAL/PLATELET
Absolute Monocytes: 1010 cells/uL — ABNORMAL HIGH (ref 200–950)
Basophils Absolute: 56 cells/uL (ref 0–200)
Basophils Relative: 0.5 %
Eosinophils Absolute: 278 cells/uL (ref 15–500)
Eosinophils Relative: 2.5 %
HCT: 39.4 % (ref 35.0–45.0)
Hemoglobin: 13.1 g/dL (ref 11.7–15.5)
Lymphs Abs: 4451 cells/uL — ABNORMAL HIGH (ref 850–3900)
MCH: 30.3 pg (ref 27.0–33.0)
MCHC: 33.2 g/dL (ref 32.0–36.0)
MCV: 91 fL (ref 80.0–100.0)
MPV: 10.4 fL (ref 7.5–12.5)
Monocytes Relative: 9.1 %
Neutro Abs: 5306 cells/uL (ref 1500–7800)
Neutrophils Relative %: 47.8 %
Platelets: 349 10*3/uL (ref 140–400)
RBC: 4.33 10*6/uL (ref 3.80–5.10)
RDW: 12.5 % (ref 11.0–15.0)
Total Lymphocyte: 40.1 %
WBC: 11.1 10*3/uL — ABNORMAL HIGH (ref 3.8–10.8)

## 2021-06-25 LAB — COMPLETE METABOLIC PANEL WITH GFR
AG Ratio: 1.4 (calc) (ref 1.0–2.5)
ALT: 18 U/L (ref 6–29)
AST: 14 U/L (ref 10–30)
Albumin: 4.1 g/dL (ref 3.6–5.1)
Alkaline phosphatase (APISO): 67 U/L (ref 31–125)
BUN: 8 mg/dL (ref 7–25)
CO2: 26 mmol/L (ref 20–32)
Calcium: 10.1 mg/dL (ref 8.6–10.2)
Chloride: 107 mmol/L (ref 98–110)
Creat: 0.67 mg/dL (ref 0.50–0.99)
Globulin: 3 g/dL (calc) (ref 1.9–3.7)
Glucose, Bld: 74 mg/dL (ref 65–99)
Potassium: 4.3 mmol/L (ref 3.5–5.3)
Sodium: 139 mmol/L (ref 135–146)
Total Bilirubin: 0.2 mg/dL (ref 0.2–1.2)
Total Protein: 7.1 g/dL (ref 6.1–8.1)
eGFR: 113 mL/min/{1.73_m2} (ref >=60)

## 2021-06-25 NOTE — Telephone Encounter (Signed)
[employer] insurance does not prefer [facility].  Otrexup is the plan preferred, no PA is required. Patient's copay for 1 month is $125.00  Otrexup Copay Card:  [id number] ID- [id number]  Copay card should pick up patient's entire copay card. Info added into [facility] pharmacy system.

## 2021-06-25 NOTE — Progress Notes (Signed)
Blood cell count is elevated, most likely due to recent cortisone injection.  CMP is normal.

## 2021-06-30 ENCOUNTER — Other Ambulatory Visit: Payer: Self-pay | Admitting: Rheumatology

## 2021-07-01 ENCOUNTER — Other Ambulatory Visit: Payer: Self-pay

## 2021-07-01 ENCOUNTER — Encounter: Payer: Self-pay | Admitting: Family Medicine

## 2021-07-01 ENCOUNTER — Other Ambulatory Visit (HOSPITAL_COMMUNITY): Payer: Self-pay

## 2021-07-01 ENCOUNTER — Ambulatory Visit (INDEPENDENT_AMBULATORY_CARE_PROVIDER_SITE_OTHER): Payer: 59 | Admitting: Family Medicine

## 2021-07-01 VITALS — BP 135/80 | HR 112 | Temp 97.5°F | Ht 65.0 in | Wt 211.7 lb

## 2021-07-01 DIAGNOSIS — M2550 Pain in unspecified joint: Secondary | ICD-10-CM | POA: Diagnosis not present

## 2021-07-01 DIAGNOSIS — Z13220 Encounter for screening for lipoid disorders: Secondary | ICD-10-CM

## 2021-07-01 DIAGNOSIS — M05772 Rheumatoid arthritis with rheumatoid factor of left ankle and foot without organ or systems involvement: Secondary | ICD-10-CM

## 2021-07-01 DIAGNOSIS — I10 Essential (primary) hypertension: Secondary | ICD-10-CM

## 2021-07-01 DIAGNOSIS — Z0000 Encounter for general adult medical examination without abnormal findings: Secondary | ICD-10-CM | POA: Diagnosis not present

## 2021-07-01 DIAGNOSIS — M05771 Rheumatoid arthritis with rheumatoid factor of right ankle and foot without organ or systems involvement: Secondary | ICD-10-CM | POA: Diagnosis not present

## 2021-07-01 DIAGNOSIS — Z23 Encounter for immunization: Secondary | ICD-10-CM

## 2021-07-01 MED ORDER — OTREXUP 15 MG/0.4ML ~~LOC~~ SOAJ
15.0000 mg | SUBCUTANEOUS | 0 refills | Status: DC
  Filled 2021-07-01: qty 4.8, fill #0
  Filled 2021-07-06: qty 4.8, 84d supply, fill #0

## 2021-07-01 NOTE — Telephone Encounter (Addendum)
Next Visit: 08/05/2021  Last Visit: 06/24/2021  DX: Rheumatoid arthritis of multiple sites with negative rheumatoid factor  Current Dose per office note 06/24/2021: Methotrexate 6 tablets p.o. weekly. BIV advised: Otrexup is the plan preferred  Labs: 06/24/2021 Blood cell count is elevated, most likely due to recent cortisone injection.  CMP is normal.  Okay to refill Otrexup?

## 2021-07-01 NOTE — Assessment & Plan Note (Signed)
Blood pressure remains fairly well controlled with amlodipine.  Continue current strength.  Refill renewed.

## 2021-07-01 NOTE — Addendum Note (Signed)
Addended by: Henriette Combs on: 07/01/2021 08:54 AM   Modules accepted: Orders

## 2021-07-01 NOTE — Assessment & Plan Note (Signed)
Well adult Orders Placed This Encounter  Procedures  . Pneumococcal conjugate vaccine 20-valent (Prevnar 20)  . Flu Vaccine QUAD 6+ mos PF IM (Fluarix Quad PF)  . CBC with Differential  . Lipid Panel w/reflex Direct LDL  . Ambulatory referral to Rheumatology    Referral Priority:   Routine    Referral Type:   Consultation    Referral Reason:   Second Opinion    Referred to Provider:   Donnetta Hail, MD    Requested Specialty:   Rheumatology    Number of Visits Requested:   1  Screening: Lipid panel Immunizations: Prevnar 20 and flu vaccine given today. Anticipatory guidance/risk factor reduction: Recommendations per AVS.

## 2021-07-01 NOTE — Assessment & Plan Note (Signed)
Requesting referral to Dr. Dierdre Forth for second opinion regarding her RA diagnosis.

## 2021-07-01 NOTE — Patient Instructions (Signed)
Preventive Care 40-40 Years Old, Female Preventive care refers to lifestyle choices and visits with your health care provider that can promote health and wellness. This includes: A yearly physical exam. This is also called an annual wellness visit. Regular dental and eye exams. Immunizations. Screening for certain conditions. Healthy lifestyle choices, such as: Eating a healthy diet. Getting regular exercise. Not using drugs or products that contain nicotine and tobacco. Limiting alcohol use. What can I expect for my preventive care visit? Physical exam Your health care provider will check your: Height and weight. These may be used to calculate your BMI (body mass index). BMI is a measurement that tells if you are at a healthy weight. Heart rate and blood pressure. Body temperature. Skin for abnormal spots. Counseling Your health care provider may ask you questions about your: Past medical problems. Family's medical history. Alcohol, tobacco, and drug use. Emotional well-being. Home life and relationship well-being. Sexual activity. Diet, exercise, and sleep habits. Work and work environment. Access to firearms. Method of birth control. Menstrual cycle. Pregnancy history. What immunizations do I need? Vaccines are usually given at various ages, according to a schedule. Your health care provider will recommend vaccines for you based on your age, medical history, and lifestyle or other factors, such as travel or where you work. What tests do I need? Blood tests Lipid and cholesterol levels. These may be checked every 5 years, or more often if you are over 50 years old. Hepatitis C test. Hepatitis B test. Screening Lung cancer screening. You may have this screening every year starting at age 55 if you have a 30-pack-year history of smoking and currently smoke or have quit within the past 15 years. Colorectal cancer screening. All adults should have this screening starting at  age 50 and continuing until age 75. Your health care provider may recommend screening at age 45 if you are at increased risk. You will have tests every 1-10 years, depending on your results and the type of screening test. Diabetes screening. This is done by checking your blood sugar (glucose) after you have not eaten for a while (fasting). You may have this done every 1-3 years. Mammogram. This may be done every 1-2 years. Talk with your health care provider about when you should start having regular mammograms. This may depend on whether you have a family history of breast cancer. BRCA-related cancer screening. This may be done if you have a family history of breast, ovarian, tubal, or peritoneal cancers. Pelvic exam and Pap test. This may be done every 3 years starting at age 21. Starting at age 30, this may be done every 5 years if you have a Pap test in combination with an HPV test. Other tests STD (sexually transmitted disease) testing, if you are at risk. Bone density scan. This is done to screen for osteoporosis. You may have this scan if you are at high risk for osteoporosis. Talk with your health care provider about your test results, treatment options, and if necessary, the need for more tests. Follow these instructions at home: Eating and drinking  Eat a diet that includes fresh fruits and vegetables, whole grains, lean protein, and low-fat dairy products. Take vitamin and mineral supplements as recommended by your health care provider. Do not drink alcohol if: Your health care provider tells you not to drink. You are pregnant, may be pregnant, or are planning to become pregnant. If you drink alcohol: Limit how much you have to 0-1 drink a day. Be   aware of how much alcohol is in your drink. In the [place] one drink equals one 12 oz bottle of beer (355 mL), one 5 oz glass of wine (148 mL), or one 1 oz glass of hard liquor (44 mL). Lifestyle Take daily care of your teeth and  gums. Brush your teeth every morning and night with fluoride toothpaste. Floss one time each day. Stay active. Exercise for at least 30 minutes 5 or more days each week. Do not use any products that contain nicotine or tobacco, such as cigarettes, e-cigarettes, and chewing tobacco. If you need help quitting, ask your health care provider. Do not use drugs. If you are sexually active, practice safe sex. Use a condom or other form of protection to prevent STIs (sexually transmitted infections). If you do not wish to become pregnant, use a form of birth control. If you plan to become pregnant, see your health care provider for a prepregnancy visit. If told by your health care provider, take low-dose aspirin daily starting at age 63. Find healthy ways to cope with stress, such as: Meditation, yoga, or listening to music. Journaling. Talking to a trusted person. Spending time with friends and family. Safety Always wear your seat belt while driving or riding in a vehicle. Do not drive: If you have been drinking alcohol. Do not ride with someone who has been drinking. When you are tired or distracted. While texting. Wear a helmet and other protective equipment during sports activities. If you have firearms in your house, make sure you follow all gun safety procedures. What's next? Visit your health care provider once a year for an annual wellness visit. Ask your health care provider how often you should have your eyes and teeth checked. Stay up to date on all vaccines. This information is not intended to replace advice given to you by your health care provider. Make sure you discuss any questions you have with your health care provider. Document Revised: 10/31/2020 Document Reviewed: 05/04/2018 Elsevier Patient Education  2022 [employer].

## 2021-07-01 NOTE — Progress Notes (Signed)
Rachel Levy - 40 y.o. female MRN [id number]  Date of birth: 1981/07/13  Subjective Chief Complaint  Patient presents with   Annual Exam    HPI Rachel Levy is a 40 year old female here today for annual exam.  She has history of hypertension and polyarthralgia diagnosed as rheumatoid arthritis.  She is currently seeing rheumatology and treated with methotrexate.  She has not noted any significant improvement since starting this.  She is unsure if she has RA and would like referral to Dr. Dierdre Forth for second opinion.  She is doing well with amlodipine for management of hypertension.  She does try to follow a pretty healthy diet.  Her exercise been limited due to ankle pain.  She is a non-smoker.  She denies alcohol use.  She would like to have updated flu and pneumonia vaccines.  Review of Systems  Constitutional:  Negative for chills, fever, malaise/fatigue and weight loss.  HENT:  Negative for congestion, ear pain and sore throat.   Eyes:  Negative for blurred vision, double vision and pain.  Respiratory:  Negative for cough and shortness of breath.   Cardiovascular:  Negative for chest pain and palpitations.  Gastrointestinal:  Negative for abdominal pain, blood in stool, constipation, heartburn and nausea.  Genitourinary:  Negative for dysuria and urgency.  Musculoskeletal:  Negative for joint pain and myalgias.  Neurological:  Negative for dizziness and headaches.  Endo/Heme/Allergies:  Does not bruise/bleed easily.  Psychiatric/Behavioral:  Negative for depression. The patient is not nervous/anxious and does not have insomnia.    Allergies  Allergen Reactions   Penicillins Hives   Doxycycline Rash   Mircette [Desogestrel-Ethinyl Estradiol] Nausea And Vomiting   Nuvaring [Etonogestrel-Ethinyl Estradiol] Other (See Comments)    Polyphagia, low libido, mood changes    Past Medical History:  Diagnosis Date   Anxiety     Past Surgical History:  Procedure Laterality Date   ANKLE  SURGERY Right    CHOLECYSTECTOMY     NASAL SEPTUM SURGERY      Social History   Socioeconomic History   Marital status: Married    Spouse name: Not on file   Number of children: Not on file   Years of education: Not on file   Highest education level: Not on file  Occupational History   Occupation: receptionist  Tobacco Use   Smoking status: Former    Packs/day: 0.50    Years: 12.00    Pack years: 6.00    Types: Cigarettes    Quit date: 02/12/2012    Years since quitting: 9.3   Smokeless tobacco: Never  Vaping Use   Vaping Use: Never used  Substance and Sexual Activity   Alcohol use: Not Currently   Drug use: Never   Sexual activity: Yes    Partners: Male    Birth control/protection: Pill  Other Topics Concern   Not on file  Social History Narrative   Not on file   Social Determinants of Health   Financial Resource Strain: Not on file  Food Insecurity: Not on file  Transportation Needs: Not on file  Physical Activity: Not on file  Stress: Not on file  Social Connections: Not on file    Family History  Problem Relation Age of Onset   Hyperlipidemia Mother    Arthritis Mother    Diabetes Father    Healthy Sister    Hyperlipidemia Maternal Aunt    Hyperlipidemia Maternal Uncle    Diabetes Maternal Grandfather     Health Maintenance  Topic Date Due   COVID-19 Vaccine (3 - Pfizer risk series) 01/25/2020   PAP SMEAR-Modifier  10/20/2022   TETANUS/TDAP  09/06/2024   Pneumococcal Vaccine 62-71 Years old  Completed   INFLUENZA VACCINE  Completed   Hepatitis C Screening  Completed   HIV Screening  Completed   HPV VACCINES  Aged Out     ----------------------------------------------------------------------------------------------------------------------------------------------------------------------------------------------------------------- Physical Exam BP 135/80 (BP Location: Left Arm, Patient Position: Sitting, Cuff Size: Normal)   Pulse (!) 112    Temp (!) 97.5 F (36.4 C)   Ht 5\' 5"  (1.651 m)   Wt 211 lb 11.2 oz (96 kg)   SpO2 97%   BMI 35.23 kg/m   Physical Exam Constitutional:      General: She is not in acute distress. HENT:     Head: Normocephalic and atraumatic.     Right Ear: Tympanic membrane and ear canal normal.     Left Ear: Tympanic membrane and ear canal normal.     Nose: Nose normal.  Eyes:     General: No scleral icterus.    Conjunctiva/sclera: Conjunctivae normal.  Neck:     Thyroid: No thyromegaly.  Cardiovascular:     Rate and Rhythm: Normal rate and regular rhythm.     Heart sounds: Normal heart sounds.  Pulmonary:     Effort: Pulmonary effort is normal.     Breath sounds: Normal breath sounds.  Abdominal:     General: Bowel sounds are normal. There is no distension.     Palpations: Abdomen is soft.     Tenderness: There is no abdominal tenderness. There is no guarding.  Musculoskeletal:        General: Normal range of motion.     Cervical back: Normal range of motion and neck supple.  Lymphadenopathy:     Cervical: No cervical adenopathy.  Skin:    General: Skin is warm and dry.     Findings: No rash.  Neurological:     General: No focal deficit present.     Mental Status: She is alert and oriented to person, place, and time.     Cranial Nerves: No cranial nerve deficit.     Coordination: Coordination normal.  Psychiatric:        Mood and Affect: Mood normal.        Behavior: Behavior normal.    ------------------------------------------------------------------------------------------------------------------------------------------------------------------------------------------------------------------- Assessment and Plan  Hypertension goal BP (blood pressure) < 130/80 Blood pressure remains fairly well controlled with amlodipine.  Continue current strength.  Refill renewed.  Well adult exam Well adult Orders Placed This Encounter  Procedures   Pneumococcal conjugate vaccine  20-valent (Prevnar 20)   Flu Vaccine QUAD 6+ mos PF IM (Fluarix Quad PF)   CBC with Differential   Lipid Panel w/reflex Direct LDL   Ambulatory referral to Rheumatology    Referral Priority:   Routine    Referral Type:   Consultation    Referral Reason:   Second Opinion    Referred to Provider:   , MD    Requested Specialty:   Rheumatology    Number of Visits Requested:   1  Screening: Lipid panel Immunizations: Prevnar 20 and flu vaccine given today. Anticipatory guidance/risk factor reduction: Recommendations per AVS.  Polyarthralgia Requesting referral to Dr. Donnetta Hail for second opinion regarding her RA diagnosis.   No orders of the defined types were placed in this encounter.   No follow-ups on file.    This visit occurred during the SARS-CoV-2 public  health emergency.  Safety protocols were in place, including screening questions prior to the visit, additional usage of staff PPE, and extensive cleaning of exam room while observing appropriate contact time as indicated for disinfecting solutions.

## 2021-07-02 LAB — CBC WITH DIFFERENTIAL/PLATELET
Absolute Monocytes: 659 cells/uL (ref 200–950)
Basophils Absolute: 65 cells/uL (ref 0–200)
Basophils Relative: 0.6 %
Eosinophils Absolute: 313 cells/uL (ref 15–500)
Eosinophils Relative: 2.9 %
HCT: 39.7 % (ref 35.0–45.0)
Hemoglobin: 13.2 g/dL (ref 11.7–15.5)
Lymphs Abs: 4234 cells/uL — ABNORMAL HIGH (ref 850–3900)
MCH: 31 pg (ref 27.0–33.0)
MCHC: 33.2 g/dL (ref 32.0–36.0)
MCV: 93.2 fL (ref 80.0–100.0)
MPV: 10.5 fL (ref 7.5–12.5)
Monocytes Relative: 6.1 %
Neutro Abs: 5530 cells/uL (ref 1500–7800)
Neutrophils Relative %: 51.2 %
Platelets: 349 10*3/uL (ref 140–400)
RBC: 4.26 10*6/uL (ref 3.80–5.10)
RDW: 13 % (ref 11.0–15.0)
Total Lymphocyte: 39.2 %
WBC: 10.8 10*3/uL (ref 3.8–10.8)

## 2021-07-02 LAB — LIPID PANEL W/REFLEX DIRECT LDL
Cholesterol: 229 mg/dL — ABNORMAL HIGH (ref <200)
HDL: 59 mg/dL (ref >=50)
LDL Cholesterol (Calc): 134 mg/dL (calc) — ABNORMAL HIGH
Non-HDL Cholesterol (Calc): 170 mg/dL (calc) — ABNORMAL HIGH (ref <130)
Total CHOL/HDL Ratio: 3.9 (calc) (ref <5.0)
Triglycerides: 214 mg/dL — ABNORMAL HIGH (ref <150)

## 2021-07-06 ENCOUNTER — Other Ambulatory Visit (HOSPITAL_COMMUNITY): Payer: Self-pay

## 2021-07-06 ENCOUNTER — Other Ambulatory Visit: Payer: Self-pay | Admitting: Osteopathic Medicine

## 2021-07-06 DIAGNOSIS — I10 Essential (primary) hypertension: Secondary | ICD-10-CM

## 2021-07-06 MED ORDER — AMLODIPINE BESYLATE 5 MG PO TABS
5.0000 mg | ORAL_TABLET | Freq: Every day | ORAL | 3 refills | Status: AC
  Filled 2021-07-06: qty 90, 90d supply, fill #0
  Filled 2021-10-21: qty 90, 90d supply, fill #1
  Filled 2022-01-11: qty 90, 90d supply, fill #2
  Filled 2022-03-15: qty 90, 90d supply, fill #3

## 2021-07-07 ENCOUNTER — Other Ambulatory Visit (HOSPITAL_COMMUNITY): Payer: Self-pay

## 2021-07-22 NOTE — Progress Notes (Deleted)
Office Visit Note  Patient: Rachel Levy             Date of Birth: [birth year]           MRN: [id number]             PCP: No primary care provider on file. Referring: Sunnie Nielsen, DO Visit Date: 08/05/2021 Occupation: @GUAROCC @  Subjective:    History of Present Illness: Rachel Levy is a 40 y.o. female with history of rheumatoid arthritis and DDD.  Patient is currently on Otrexup 50 mg sq injections once weekly and folic acid 2 mg daily.  CBC and CMP were updated on 06/24/2021.  She will be due to update lab work in January and every 3 months to monitor for drug toxicity. Standing orders for CBC and CMP are in place.    Activities of Daily Living:  Patient reports morning stiffness for *** {minute/hour:19697}.   Patient {ACTIONS;DENIES/REPORTS:21021675::"Denies"} nocturnal pain.  Difficulty dressing/grooming: {ACTIONS;DENIES/REPORTS:21021675::"Denies"} Difficulty climbing stairs: {ACTIONS;DENIES/REPORTS:21021675::"Denies"} Difficulty getting out of chair: {ACTIONS;DENIES/REPORTS:21021675::"Denies"} Difficulty using hands for taps, buttons, cutlery, and/or writing: {ACTIONS;DENIES/REPORTS:21021675::"Denies"}  No Rheumatology ROS completed.   PMFS History:  Patient Active Problem List   Diagnosis Date Noted   Polyarthralgia 04/13/2021   Calculus of gallbladder without cholecystitis without obstruction 12/13/2019   Oral contraceptive pill surveillance 04/19/2019   Epiphora, right 04/19/2019   Deviated nasal septum 04/19/2019   Hypertension goal BP (blood pressure) < 130/80 03/15/2019   GAD (generalized anxiety disorder) 02/08/2019   Well adult exam 02/08/2019   Osteochondral defect of talus 10/01/2018   Right lumbar radiculitis 10/01/2018   Phlebolith 09/29/2018   Breakthrough bleeding associated with intrauterine device (IUD) 11/17/2017    Past Medical History:  Diagnosis Date   Anxiety     Family History  Problem Relation Age of Onset   Hyperlipidemia  Mother    Arthritis Mother    Diabetes Father    Healthy Sister    Hyperlipidemia Maternal Aunt    Hyperlipidemia Maternal Uncle    Diabetes Maternal Grandfather    Past Surgical History:  Procedure Laterality Date   ANKLE SURGERY Right    CHOLECYSTECTOMY     NASAL SEPTUM SURGERY     Social History   Social History Narrative   Not on file   Immunization History  Administered Date(s) Administered   Influenza,inj,Quad PF,6+ Mos 10/20/2017, 09/27/2018, 07/18/2019, 06/17/2020, 07/01/2021   PFIZER(Purple Top)SARS-COV-2 Vaccination 12/07/2019, 12/28/2019   PNEUMOCOCCAL CONJUGATE-20 07/01/2021   Tdap 09/06/2014     Objective: Vital Signs: There were no vitals taken for this visit.   Physical Exam Vitals and nursing note reviewed.  Constitutional:      Appearance: She is well-developed.  HENT:     Head: Normocephalic and atraumatic.  Eyes:     Conjunctiva/sclera: Conjunctivae normal.  Pulmonary:     Effort: Pulmonary effort is normal.  Abdominal:     Palpations: Abdomen is soft.  Musculoskeletal:     Cervical back: Normal range of motion.  Skin:    General: Skin is warm and dry.     Capillary Refill: Capillary refill takes less than 2 seconds.  Neurological:     Mental Status: She is alert and oriented to person, place, and time.  Psychiatric:        Behavior: Behavior normal.     Musculoskeletal Exam: ***  CDAI Exam: CDAI Score: -- Patient Global: --; Provider Global: -- Swollen: --; Tender: -- Joint Exam 08/05/2021   No joint exam  has been documented for this visit   There is currently no information documented on the homunculus. Go to the Rheumatology activity and complete the homunculus joint exam.  Investigation: No additional findings.  Imaging: No results found.  Recent Labs: Lab Results  Component Value Date   WBC 10.8 07/01/2021   HGB 13.2 07/01/2021   PLT 349 07/01/2021   NA 139 06/24/2021   K 4.3 06/24/2021   CL 107 06/24/2021   CO2  26 06/24/2021   GLUCOSE 74 06/24/2021   BUN 8 06/24/2021   CREATININE 0.67 06/24/2021   BILITOT 0.2 06/24/2021   AST 14 06/24/2021   ALT 18 06/24/2021   PROT 7.1 06/24/2021   CALCIUM 10.1 06/24/2021   GFRAA 115 04/19/2019   QFTBGOLDPLUS NEGATIVE 05/25/2021    Speciality Comments: No specialty comments available.  Procedures:  No procedures performed Allergies: Penicillins, Doxycycline, Mircette [desogestrel-ethinyl estradiol], and Nuvaring [etonogestrel-ethinyl estradiol]   Assessment / Plan:     Visit Diagnoses: No diagnosis found.  Orders: No orders of the defined types were placed in this encounter.  No orders of the defined types were placed in this encounter.   Face-to-face time spent with patient was *** minutes. Greater than 50% of time was spent in counseling and coordination of care.  Follow-Up Instructions: No follow-ups on file.   Earnestine Mealing, CMA  Note - This record has been created using Editor, commissioning.  Chart creation errors have been sought, but may not always  have been located. Such creation errors do not reflect on  the standard of medical care.

## 2021-07-24 ENCOUNTER — Other Ambulatory Visit: Payer: Self-pay

## 2021-07-24 ENCOUNTER — Ambulatory Visit: Payer: 59 | Admitting: Podiatry

## 2021-07-24 ENCOUNTER — Ambulatory Visit (INDEPENDENT_AMBULATORY_CARE_PROVIDER_SITE_OTHER): Payer: 59 | Admitting: Podiatry

## 2021-07-24 DIAGNOSIS — M7751 Other enthesopathy of right foot: Secondary | ICD-10-CM | POA: Diagnosis not present

## 2021-07-24 DIAGNOSIS — M7752 Other enthesopathy of left foot: Secondary | ICD-10-CM | POA: Diagnosis not present

## 2021-07-24 DIAGNOSIS — M722 Plantar fascial fibromatosis: Secondary | ICD-10-CM

## 2021-07-24 NOTE — Patient Instructions (Signed)
For instructions on how to put on your Night Splint, please visit [email or web address]  Plantar Fasciitis (Heel Spur Syndrome) with Rehab The plantar fascia is a fibrous, ligament-like, soft-tissue structure that spans the bottom of the foot. Plantar fasciitis is a condition that causes pain in the foot due to inflammation of the tissue. SYMPTOMS  Pain and tenderness on the underneath side of the foot. Pain that worsens with standing or walking. CAUSES  Plantar fasciitis is caused by irritation and injury to the plantar fascia on the underneath side of the foot. Common mechanisms of injury include: Direct trauma to bottom of the foot. Damage to a small nerve that runs under the foot where the main fascia attaches to the heel bone. Stress placed on the plantar fascia due to bone spurs. RISK INCREASES WITH:  Activities that place stress on the plantar fascia (running, jumping, pivoting, or cutting). Poor strength and flexibility. Improperly fitted shoes. Tight calf muscles. Flat feet. Failure to warm-up properly before activity. Obesity. PREVENTION Warm up and stretch properly before activity. Allow for adequate recovery between workouts. Maintain physical fitness: Strength, flexibility, and endurance. Cardiovascular fitness. Maintain a health body weight. Avoid stress on the plantar fascia. Wear properly fitted shoes, including arch supports for individuals who have flat feet.  PROGNOSIS  If treated properly, then the symptoms of plantar fasciitis usually resolve without surgery. However, occasionally surgery is necessary.  RELATED COMPLICATIONS  Recurrent symptoms that may result in a chronic condition. Problems of the lower back that are caused by compensating for the injury, such as limping. Pain or weakness of the foot during push-off following surgery. Chronic inflammation, scarring, and partial or complete fascia tear, occurring more often from repeated  injections.  TREATMENT  Treatment initially involves the use of ice and medication to help reduce pain and inflammation. The use of strengthening and stretching exercises may help reduce pain with activity, especially stretches of the Achilles tendon. These exercises may be performed at home or with a therapist. Your caregiver may recommend that you use heel cups of arch supports to help reduce stress on the plantar fascia. Occasionally, corticosteroid injections are given to reduce inflammation. If symptoms persist for greater than 6 months despite non-surgical (conservative), then surgery may be recommended.   MEDICATION  If pain medication is necessary, then nonsteroidal anti-inflammatory medications, such as aspirin and ibuprofen, or other minor pain relievers, such as acetaminophen, are often recommended. Do not take pain medication within 7 days before surgery. Prescription pain relievers may be given if deemed necessary by your caregiver. Use only as directed and only as much as you need. Corticosteroid injections may be given by your caregiver. These injections should be reserved for the most serious cases, because they may only be given a certain number of times.  HEAT AND COLD Cold treatment (icing) relieves pain and reduces inflammation. Cold treatment should be applied for 10 to 15 minutes every 2 to 3 hours for inflammation and pain and immediately after any activity that aggravates your symptoms. Use ice packs or massage the area with a piece of ice (ice massage). Heat treatment may be used prior to performing the stretching and strengthening activities prescribed by your caregiver, physical therapist, or athletic trainer. Use a heat pack or soak the injury in warm water.  SEEK IMMEDIATE MEDICAL CARE IF: Treatment seems to offer no benefit, or the condition worsens. Any medications produce adverse side effects.  EXERCISES- RANGE OF MOTION (ROM) AND STRETCHING EXERCISES - Plantar  Fasciitis (Heel  Spur Syndrome) These exercises may help you when beginning to rehabilitate your injury. Your symptoms may resolve with or without further involvement from your physician, physical therapist or athletic trainer. While completing these exercises, remember:  Restoring tissue flexibility helps normal motion to return to the joints. This allows healthier, less painful movement and activity. An effective stretch should be held for at least 30 seconds. A stretch should never be painful. You should only feel a gentle lengthening or release in the stretched tissue.  RANGE OF MOTION - Toe Extension, Flexion Sit with your right / left leg crossed over your opposite knee. Grasp your toes and gently pull them back toward the top of your foot. You should feel a stretch on the bottom of your toes and/or foot. Hold this stretch for 10 seconds. Now, gently pull your toes toward the bottom of your foot. You should feel a stretch on the top of your toes and or foot. Hold this stretch for 10 seconds. Repeat  times. Complete this stretch 3 times per day.   RANGE OF MOTION - Ankle Dorsiflexion, Active Assisted Remove shoes and sit on a chair that is preferably not on a carpeted surface. Place right / left foot under knee. Extend your opposite leg for support. Keeping your heel down, slide your right / left foot back toward the chair until you feel a stretch at your ankle or calf. If you do not feel a stretch, slide your bottom forward to the edge of the chair, while still keeping your heel down. Hold this stretch for 10 seconds. Repeat 3 times. Complete this stretch 2 times per day.   STRETCH  Gastroc, Standing Place hands on wall. Extend right / left leg, keeping the front knee somewhat bent. Slightly point your toes inward on your back foot. Keeping your right / left heel on the floor and your knee straight, shift your weight toward the wall, not allowing your back to arch. You should feel a  gentle stretch in the right / left calf. Hold this position for 10 seconds. Repeat 3 times. Complete this stretch 2 times per day.  STRETCH  Soleus, Standing Place hands on wall. Extend right / left leg, keeping the other knee somewhat bent. Slightly point your toes inward on your back foot. Keep your right / left heel on the floor, bend your back knee, and slightly shift your weight over the back leg so that you feel a gentle stretch deep in your back calf. Hold this position for 10 seconds. Repeat 3 times. Complete this stretch 2 times per day.  STRETCH  Gastrocsoleus, Standing  Note: This exercise can place a lot of stress on your foot and ankle. Please complete this exercise only if specifically instructed by your caregiver.  Place the ball of your right / left foot on a step, keeping your other foot firmly on the same step. Hold on to the wall or a rail for balance. Slowly lift your other foot, allowing your body weight to press your heel down over the edge of the step. You should feel a stretch in your right / left calf. Hold this position for 10 seconds. Repeat this exercise with a slight bend in your right / left knee. Repeat 3 times. Complete this stretch 2 times per day.   STRENGTHENING EXERCISES - Plantar Fasciitis (Heel Spur Syndrome)  These exercises may help you when beginning to rehabilitate your injury. They may resolve your symptoms with or without further involvement from your  physician, physical therapist or athletic trainer. While completing these exercises, remember:  Muscles can gain both the endurance and the strength needed for everyday activities through controlled exercises. Complete these exercises as instructed by your physician, physical therapist or athletic trainer. Progress the resistance and repetitions only as guided.  STRENGTH - Towel Curls Sit in a chair positioned on a non-carpeted surface. Place your foot on a towel, keeping your heel on the  floor. Pull the towel toward your heel by only curling your toes. Keep your heel on the floor. Repeat 3 times. Complete this exercise 2 times per day.  STRENGTH - Ankle Inversion Secure one end of a rubber exercise band/tubing to a fixed object (table, pole). Loop the other end around your foot just before your toes. Place your fists between your knees. This will focus your strengthening at your ankle. Slowly, pull your big toe up and in, making sure the band/tubing is positioned to resist the entire motion. Hold this position for 10 seconds. Have your muscles resist the band/tubing as it slowly pulls your foot back to the starting position. Repeat 3 times. Complete this exercises 2 times per day.  Document Released: 08/23/2005 Document Revised: 11/15/2011 Document Reviewed: 12/05/2008 [employer] Patient Information 2014 [place], [place]. Ankle Sprain An ankle sprain is a stretch or tear in one of the tough tissues (ligaments) that connect the bones in your ankle. An ankle sprain can happen when the ankle rolls outward (inversion sprain) or inward (eversion sprain). What are the causes? This condition is caused by rolling or twisting the ankle. What increases the risk? You are more likely to develop this condition if you play sports. What are the signs or symptoms? Symptoms of this condition include: Pain in your ankle. Swelling. Bruising. This may happen right after you sprain your ankle or 1-2 days later. Trouble standing or walking. How is this diagnosed? This condition is diagnosed with: A physical exam. During the exam, your doctor will press on certain parts of your foot and ankle and try to move them in certain ways. X-ray imaging. These may be taken to see how bad the sprain is and to check for broken bones. How is this treated? This condition may be treated with: A brace or splint. This is used to keep the ankle from moving until it heals. An elastic bandage. This is used to  support the ankle. Crutches. Pain medicine. Surgery. This may be needed if the sprain is very bad. Physical therapy. This may help to improve movement in the ankle. Follow these instructions at home: If you have a brace or a splint: Wear the brace or splint as told by your doctor. Remove it only as told by your doctor. Loosen the brace or splint if your toes: Tingle. Lose feeling (become numb). Turn cold and blue. Keep the brace or splint clean. If the brace or splint is not waterproof: Do not let it get wet. Cover it with a watertight covering when you take a bath or a shower. If you have an elastic bandage (dressing): Remove it to shower or bathe. Try not to move your ankle much, but wiggle your toes from time to time. This helps to prevent swelling. Adjust the dressing if it feels too tight. Loosen the dressing if your foot: Loses feeling. Tingles. Becomes cold and blue. Managing pain, stiffness, and swelling  Take over-the-counter and prescription medicines only as told by doctor. For 2-3 days, keep your ankle raised (elevated) above the level of your  heart. If told, put ice on the injured area: If you have a removable brace or splint, remove it as told by your doctor. Put ice in a plastic bag. Place a towel between your skin and the bag. Leave the ice on for 20 minutes, 2-3 times a day. General instructions Rest your ankle. Do not use your injured leg to support your body weight until your doctor says that you can. Use crutches as told by your doctor. Do not use any products that contain nicotine or tobacco, such as cigarettes, e-cigarettes, and chewing tobacco. If you need help quitting, ask your doctor. Keep all follow-up visits as told by your doctor. Contact a doctor if: Your bruises or swelling are quickly getting worse. Your pain does not get better after you take medicine. Get help right away if: You cannot feel your toes or foot. Your foot or toes look  blue. You have very bad pain that gets worse. Summary An ankle sprain is a stretch or tear in one of the tough tissues (ligaments) that connect the bones in your ankle. This condition is caused by rolling or twisting the ankle. Symptoms include pain, swelling, bruising, and trouble walking. To help with pain and swelling, put ice on the injured ankle, raise your ankle above the level of your heart, and use an elastic bandage. Also, rest as told by your doctor. Keep all follow-up visits as told by your doctor. This is important. This information is not intended to replace advice given to you by your health care provider. Make sure you discuss any questions you have with your health care provider. Document Revised: 10/16/2020 Document Reviewed: 10/16/2020 Elsevier Patient Education  2022 [employer].

## 2021-07-27 NOTE — Progress Notes (Signed)
Subjective: 40 year old female presents the office today for evaluation of right ankle pain.  She said that her right ankles been feeling great since last appointment and no concerns on the right side.  She is get some discomfort in the left side however.  She states that it is mostly at night and she gets pain, burning to the heel because of her leg at times.  She said that during the day it feels fine.  No recent new changes.   Objective: AAO x3, NAD DP/PT pulses palpable bilaterally, CRT less than 3 seconds On the right side there is no tenderness palpation of the ankle or foot.  On the left foot she does get some discomfort on the course of the plantar fascia as well as the ankle there is no specific area of pinpoint tenderness.  No edema, erythema.  MMT 5/5.  No pain with calf compression, swelling, warmth, erythema  Assessment: Plantar fasciitis, capsulitis ankle  Plan: -All treatment options discussed with the patient including all alternatives, risks, complications.  -We discussed different causes of the left foot, ankle pain.  When she stretches it and she walks it feels better but she does get the pain at nighttime.  Discussed possible nerve issues or back issues that are causing the symptoms however she seems to think it is more of a localized foot issue.  Discussed continuation good shoes, arch support.  Stretching exercises daily.  I dispensed a night splint as well.  If symptoms do not improve consider nerve conduction test. -Patient encouraged to call the office with any questions, concerns, change in symptoms.   Rachel Levy DPM

## 2021-08-05 ENCOUNTER — Ambulatory Visit: Payer: 59 | Admitting: Physician Assistant

## 2021-08-05 DIAGNOSIS — M249 Joint derangement, unspecified: Secondary | ICD-10-CM

## 2021-08-05 DIAGNOSIS — M958 Other specified acquired deformities of musculoskeletal system: Secondary | ICD-10-CM

## 2021-08-05 DIAGNOSIS — M542 Cervicalgia: Secondary | ICD-10-CM

## 2021-08-05 DIAGNOSIS — F411 Generalized anxiety disorder: Secondary | ICD-10-CM

## 2021-08-05 DIAGNOSIS — M25579 Pain in unspecified ankle and joints of unspecified foot: Secondary | ICD-10-CM

## 2021-08-05 DIAGNOSIS — R768 Other specified abnormal immunological findings in serum: Secondary | ICD-10-CM

## 2021-08-05 DIAGNOSIS — I10 Essential (primary) hypertension: Secondary | ICD-10-CM

## 2021-08-05 DIAGNOSIS — M0609 Rheumatoid arthritis without rheumatoid factor, multiple sites: Secondary | ICD-10-CM

## 2021-08-05 DIAGNOSIS — Z79899 Other long term (current) drug therapy: Secondary | ICD-10-CM

## 2021-08-05 DIAGNOSIS — M5136 Other intervertebral disc degeneration, lumbar region: Secondary | ICD-10-CM

## 2021-08-10 ENCOUNTER — Other Ambulatory Visit (HOSPITAL_COMMUNITY): Payer: Self-pay

## 2021-09-01 NOTE — Progress Notes (Signed)
Office Visit Note  Patient: Rachel Levy             Date of Birth: 10-30-1980           MRN: [id number]             PCP: Everrett Coombe, DO Referring: No ref. provider found Visit Date: 09/15/2021 Occupation: @GUAROCC @  Subjective:  Medication monitoring   History of Present Illness: Rachel Levy is a 40 y.o. female with history of seronegative rheumatoid arthritis and osteoarthritis.  Patient is currently on Otrexup 15 mg subcutaneous injections once weekly and folic acid 2 mg daily.  She is tolerating otrexup without any side effects.  She has not missed any doses recently.  She has noticed about a 80% improvement since initiating methotrexate.  She has persistent discomfort in the right ankle joint.  She has ongoing appointment with Dr. 41 on 09/25/2021.  She states that she stands for long bit of time her right ankle swells.  She states that her left ankle and foot pain has resolved.  Her morning stiffness has resolved.  She has not had any difficulty with ADLs. She denies any recent infections.     Activities of Daily Living:  Patient reports morning stiffness for 0 minutes.   Patient Reports nocturnal pain.  Difficulty dressing/grooming: Denies Difficulty climbing stairs: Reports Difficulty getting out of chair: Denies Difficulty using hands for taps, buttons, cutlery, and/or writing: Denies  Review of Systems  Constitutional:  Negative for fatigue.  HENT:  Negative for mouth sores, mouth dryness and nose dryness.   Eyes:  Negative for pain, itching and dryness.  Respiratory:  Negative for shortness of breath and difficulty breathing.   Cardiovascular:  Negative for chest pain and palpitations.  Gastrointestinal:  Negative for blood in stool, constipation and diarrhea.  Endocrine: Negative for increased urination.  Genitourinary:  Negative for difficulty urinating.  Musculoskeletal:  Positive for joint swelling. Negative for joint pain, joint pain, myalgias,  morning stiffness, muscle tenderness and myalgias.  Skin:  Negative for color change, rash and redness.  Allergic/Immunologic: Negative for susceptible to infections.  Neurological:  Negative for dizziness, numbness, headaches, memory loss and weakness.  Hematological:  Negative for bruising/bleeding tendency.  Psychiatric/Behavioral:  Negative for confusion.    PMFS History:  Patient Active Problem List   Diagnosis Date Noted   Polyarthralgia 04/13/2021   Calculus of gallbladder without cholecystitis without obstruction 12/13/2019   Oral contraceptive pill surveillance 04/19/2019   Epiphora, right 04/19/2019   Deviated nasal septum 04/19/2019   Hypertension goal BP (blood pressure) < 130/80 03/15/2019   GAD (generalized anxiety disorder) 02/08/2019   Well adult exam 02/08/2019   Osteochondral defect of talus 10/01/2018   Right lumbar radiculitis 10/01/2018   Phlebolith 09/29/2018   Breakthrough bleeding associated with intrauterine device (IUD) 11/17/2017    Past Medical History:  Diagnosis Date   Anxiety     Family History  Problem Relation Age of Onset   Hyperlipidemia Mother    Arthritis Mother    Diabetes Father    Healthy Sister    Hyperlipidemia Maternal Aunt    Hyperlipidemia Maternal Uncle    Diabetes Maternal Grandfather    Past Surgical History:  Procedure Laterality Date   ANKLE SURGERY Right    CHOLECYSTECTOMY     NASAL SEPTUM SURGERY     Social History   Social History Narrative   Not on file   Immunization History  Administered Date(s) Administered   Influenza,inj,Quad  PF,6+ Mos 10/20/2017, 09/27/2018, 07/18/2019, 06/17/2020, 07/01/2021   PFIZER(Purple Top)SARS-COV-2 Vaccination 12/07/2019, 12/28/2019   PNEUMOCOCCAL CONJUGATE-20 07/01/2021   Tdap 09/06/2014     Objective: Vital Signs: BP (!) 149/86 (BP Location: Left Arm, Patient Position: Sitting, Cuff Size: Large)    Pulse 87    Ht 5\' 5"  (1.651 m)    Wt 216 lb (98 kg)    BMI 35.94 kg/m     Physical Exam Vitals and nursing note reviewed.  Constitutional:      Appearance: She is well-developed.  HENT:     Head: Normocephalic and atraumatic.  Eyes:     Conjunctiva/sclera: Conjunctivae normal.  Pulmonary:     Effort: Pulmonary effort is normal.  Abdominal:     Palpations: Abdomen is soft.  Musculoskeletal:     Cervical back: Normal range of motion.  Skin:    General: Skin is warm and dry.     Capillary Refill: Capillary refill takes less than 2 seconds.  Neurological:     Mental Status: She is alert and oriented to person, place, and time.  Psychiatric:        Behavior: Behavior normal.     Musculoskeletal Exam: C-spine, thoracic spine, lumbar spine have good range of motion with no discomfort.  Shoulder joints, elbow joints, wrist joints, MCPs, PIPs, DIPs have good range of motion with no synovitis.  Complete fist formation bilaterally.  Hip joints have good range of motion with no groin pain.  Knee joints have good range of motion with no warmth or effusion.  Tenderness and inflammation of the right ankle noted.  Left ankle joint has good range of motion with no tenderness or inflammation.  No evidence of Achilles tendinitis or plantar fasciitis at this time.  No tenderness over MTP joints.  CDAI Exam: CDAI Score: 0.2  Patient Global: 1 mm; Provider Global: 1 mm Swollen: 0 ; Tender: 0  Joint Exam 09/15/2021   No joint exam has been documented for this visit   There is currently no information documented on the homunculus. Go to the Rheumatology activity and complete the homunculus joint exam.  Investigation: No additional findings.  Imaging: No results found.  Recent Labs: Lab Results  Component Value Date   WBC 10.8 07/01/2021   HGB 13.2 07/01/2021   PLT 349 07/01/2021   NA 139 06/24/2021   K 4.3 06/24/2021   CL 107 06/24/2021   CO2 26 06/24/2021   GLUCOSE 74 06/24/2021   BUN 8 06/24/2021   CREATININE 0.67 06/24/2021   BILITOT 0.2 06/24/2021    AST 14 06/24/2021   ALT 18 06/24/2021   PROT 7.1 06/24/2021   CALCIUM 10.1 06/24/2021   GFRAA 115 04/19/2019   QFTBGOLDPLUS NEGATIVE 05/25/2021    Speciality Comments: No specialty comments available.  Procedures:  No procedures performed Allergies: Penicillins, Doxycycline, Mircette [desogestrel-ethinyl estradiol], and Nuvaring [etonogestrel-ethinyl estradiol]   Assessment / Plan:     Visit Diagnoses: Rheumatoid arthritis of multiple sites with negative rheumatoid factor ([place]) - She has ongoing tenderness and mild inflammation in the right ankle.  She has difficulty standing for prolonged periods of time due to severity of pain and swelling in her right ankle.  Overall she has noticed about an 80% improvement in her joint pain and inflammation since initiating methotrexate in October 2022.  She is currently on Otrexup 15 mg sqinjections once weekly and folic acid 2 mg daily.  She has been tolerating Otrexup without any side effects.  The discomfort in her  shoulders, wrist, and lower back have improved significantly.  Discussed increasing the dose of Otrexup to 20 mg subcu injections once weekly to see if it will improve her right ankle joint pain.  She was in agreement.  A new prescription for Otrexup 20 mg apiece injections will be sent to the pharmacy.  She was advised to notify us if she cannot tolerate the increased dose of Otrexup.  She will follow-up in the office in 2 months to assess her response.  Positive anti-CCP test - May 01, 2021 Anti-CCP 33-weak positive  High risk medication use - Otrexup 20 mg sq injections once weekly, folic acid 2 mg p.o. daily.  CMP updated on 06/24/2021.  CBC updated on 07/01/2021.  She is due to update lab work this month.  Orders for CBC and CMP were released.  She will return for lab work in 2 weeks after increasing the dose of Otrexup. She will then require updated lab work every 3 months to monitor for drug toxicity.   - Plan: COMPLETE METABOLIC  PANEL WITH GFR, CBC with Differential/Platelet She has not had any recent infections.  Discussed the importance of holding Otrexup if she develops signs or symptoms of infection and to resume once the infection has completely cleared.  Pain in joint involving ankle and foot, unspecified laterality: S/p surgery right ankle.  Upcoming appoint with Dr. Earleen Newport on 09/25/2021.  She has ongoing tenderness and inflammation in the right ankle.  She has not been wearing a brace recently.    Osteochondral defect of talus - S/p surgery by Dr. Jacqualyn Posey.   Neck pain - History of DDD based on MRI findings per patient.  She has good range of motion of the C-spine with no discomfort at this time.  DDD (degenerative disc disease), lumbar - With intermittent right sided radiculitis.  She is not experiencing any increased discomfort in her lower back at this time.  Other medical conditions are listed as follows:  Hypermobility of joint  Hypertension goal BP (blood pressure) < 130/80  GAD (generalized anxiety disorder)  Orders: Orders Placed This Encounter  Procedures   COMPLETE METABOLIC PANEL WITH GFR   CBC with Differential/Platelet   No orders of the defined types were placed in this encounter.   Follow-Up Instructions: Return in about 2 months (around 11/13/2021) for Rheumatoid arthritis, Osteoarthritis.   Ofilia Neas, PA-C  Note - This record has been created using Dragon software.  Chart creation errors have been sought, but may not always  have been located. Such creation errors do not reflect on  the standard of medical care.

## 2021-09-04 ENCOUNTER — Other Ambulatory Visit (HOSPITAL_COMMUNITY): Payer: Self-pay

## 2021-09-15 ENCOUNTER — Other Ambulatory Visit: Payer: Self-pay

## 2021-09-15 ENCOUNTER — Other Ambulatory Visit (HOSPITAL_COMMUNITY): Payer: Self-pay

## 2021-09-15 ENCOUNTER — Encounter: Payer: Self-pay | Admitting: Physician Assistant

## 2021-09-15 ENCOUNTER — Ambulatory Visit (INDEPENDENT_AMBULATORY_CARE_PROVIDER_SITE_OTHER): Payer: 59 | Admitting: Physician Assistant

## 2021-09-15 VITALS — BP 149/86 | HR 87 | Ht 65.0 in | Wt 216.0 lb

## 2021-09-15 DIAGNOSIS — M0609 Rheumatoid arthritis without rheumatoid factor, multiple sites: Secondary | ICD-10-CM | POA: Diagnosis not present

## 2021-09-15 DIAGNOSIS — R768 Other specified abnormal immunological findings in serum: Secondary | ICD-10-CM

## 2021-09-15 DIAGNOSIS — M249 Joint derangement, unspecified: Secondary | ICD-10-CM | POA: Diagnosis not present

## 2021-09-15 DIAGNOSIS — M25579 Pain in unspecified ankle and joints of unspecified foot: Secondary | ICD-10-CM

## 2021-09-15 DIAGNOSIS — I10 Essential (primary) hypertension: Secondary | ICD-10-CM

## 2021-09-15 DIAGNOSIS — M5136 Other intervertebral disc degeneration, lumbar region: Secondary | ICD-10-CM | POA: Diagnosis not present

## 2021-09-15 DIAGNOSIS — Z79899 Other long term (current) drug therapy: Secondary | ICD-10-CM | POA: Diagnosis not present

## 2021-09-15 DIAGNOSIS — M542 Cervicalgia: Secondary | ICD-10-CM | POA: Diagnosis not present

## 2021-09-15 DIAGNOSIS — M958 Other specified acquired deformities of musculoskeletal system: Secondary | ICD-10-CM | POA: Diagnosis not present

## 2021-09-15 DIAGNOSIS — F411 Generalized anxiety disorder: Secondary | ICD-10-CM

## 2021-09-15 MED ORDER — METHOTREXATE (PF) 20 MG/0.4ML ~~LOC~~ SOAJ
20.0000 mg | SUBCUTANEOUS | 0 refills | Status: DC
  Filled 2021-09-15: qty 1.6, 30d supply, fill #0
  Filled 2021-09-22: qty 1.6, 28d supply, fill #0

## 2021-09-15 NOTE — Telephone Encounter (Signed)
Otrexup 20mg  refill pended. Please review and send to the pharmacy.

## 2021-09-15 NOTE — Patient Instructions (Signed)
Standing Labs We placed an order today for your standing lab work.   Please have your standing labs drawn in 2 weeks   If possible, please have your labs drawn 2 weeks prior to your appointment so that the provider can discuss your results at your appointment.  Please note that you may see your imaging and lab results in MyChart before we have reviewed them. We may be awaiting multiple results to interpret others before contacting you. Please allow our office up to 72 hours to thoroughly review all of the results before contacting the office for clarification of your results.  We have open lab daily: Monday through Thursday from 1:30-4:30 PM and Friday from 1:30-4:00 PM at the office of Dr. Shaili Deveshwar, [facility].   Please be advised, all patients with office appointments requiring lab work will take precedent over walk-in lab work.  If possible, please come for your lab work on Monday and Friday afternoons, as you may experience shorter wait times. The office is located at [address], [address], [place], [place] [address] No appointment is necessary.   Labs are drawn by [facility]. Please bring your co-pay at the time of your lab draw.  You may receive a bill from [facility] for your lab work.  If you wish to have your labs drawn at another location, please call the office 24 hours in advance to send orders.  If you have any questions regarding directions or hours of operation,  please call [phone].   As a reminder, please drink plenty of water prior to coming for your lab work. Thanks!  

## 2021-09-16 LAB — CBC WITH DIFFERENTIAL/PLATELET
Absolute Monocytes: 505 cells/uL (ref 200–950)
Basophils Absolute: 52 cells/uL (ref 0–200)
Basophils Relative: 0.6 %
Eosinophils Absolute: 157 cells/uL (ref 15–500)
Eosinophils Relative: 1.8 %
HCT: 39.9 % (ref 35.0–45.0)
Hemoglobin: 13.5 g/dL (ref 11.7–15.5)
Lymphs Abs: 3202 cells/uL (ref 850–3900)
MCH: 31.6 pg (ref 27.0–33.0)
MCHC: 33.8 g/dL (ref 32.0–36.0)
MCV: 93.4 fL (ref 80.0–100.0)
MPV: 10.2 fL (ref 7.5–12.5)
Monocytes Relative: 5.8 %
Neutro Abs: 4785 cells/uL (ref 1500–7800)
Neutrophils Relative %: 55 %
Platelets: 324 10*3/uL (ref 140–400)
RBC: 4.27 10*6/uL (ref 3.80–5.10)
RDW: 13 % (ref 11.0–15.0)
Total Lymphocyte: 36.8 %
WBC: 8.7 10*3/uL (ref 3.8–10.8)

## 2021-09-16 LAB — COMPLETE METABOLIC PANEL WITH GFR
AG Ratio: 1.6 (calc) (ref 1.0–2.5)
ALT: 14 U/L (ref 6–29)
AST: 15 U/L (ref 10–30)
Albumin: 4.2 g/dL (ref 3.6–5.1)
Alkaline phosphatase (APISO): 68 U/L (ref 31–125)
BUN: 10 mg/dL (ref 7–25)
CO2: 25 mmol/L (ref 20–32)
Calcium: 9.8 mg/dL (ref 8.6–10.2)
Chloride: 108 mmol/L (ref 98–110)
Creat: 0.78 mg/dL (ref 0.50–0.99)
Globulin: 2.6 g/dL (calc) (ref 1.9–3.7)
Glucose, Bld: 93 mg/dL (ref 65–99)
Potassium: 4.6 mmol/L (ref 3.5–5.3)
Sodium: 140 mmol/L (ref 135–146)
Total Bilirubin: 0.4 mg/dL (ref 0.2–1.2)
Total Protein: 6.8 g/dL (ref 6.1–8.1)
eGFR: 98 mL/min/{1.73_m2} (ref >=60)

## 2021-09-16 NOTE — Progress Notes (Signed)
CBC and CMP WNL

## 2021-09-22 ENCOUNTER — Other Ambulatory Visit (HOSPITAL_COMMUNITY): Payer: Self-pay

## 2021-09-22 NOTE — Telephone Encounter (Signed)
yes

## 2021-09-22 NOTE — Telephone Encounter (Signed)
Rx was sent for Rasuvo not Otrexup. Copay for Otrexup revealed that copay is $684.06 per 28 day supply. Copay card will only cover $125 per month. Patent states she will be unable to afford. Patient must pay $3000 OOP deductible before insurance will pay towards medication. Patient advised that if she finds that she has met deductible at any point this year, we can switch her back to [facility].  I reviewed MTX vial/syringe. Initially patient was open to trying - then she asked if oral MTX was an option again because she is terrified of needles and would prefer not to draw up medication. Routing to Dr. Estanislado Pandy for advisement if ok to switch patient back to oral.  Knox Saliva, PharmD, MPH, BCPS Clinical Pharmacist (Rheumatology and Pulmonology)

## 2021-09-24 ENCOUNTER — Other Ambulatory Visit (HOSPITAL_COMMUNITY): Payer: Self-pay

## 2021-09-24 MED ORDER — METHOTREXATE 2.5 MG PO TABS
20.0000 mg | ORAL_TABLET | ORAL | 0 refills | Status: DC
  Filled 2021-09-24: qty 96, 84d supply, fill #0

## 2021-09-25 ENCOUNTER — Other Ambulatory Visit: Payer: Self-pay

## 2021-09-25 ENCOUNTER — Other Ambulatory Visit (HOSPITAL_COMMUNITY): Payer: Self-pay

## 2021-09-25 ENCOUNTER — Telehealth: Payer: Self-pay | Admitting: *Deleted

## 2021-09-25 ENCOUNTER — Ambulatory Visit (INDEPENDENT_AMBULATORY_CARE_PROVIDER_SITE_OTHER): Payer: 59 | Admitting: Podiatry

## 2021-09-25 DIAGNOSIS — M7752 Other enthesopathy of left foot: Secondary | ICD-10-CM

## 2021-09-25 DIAGNOSIS — M7751 Other enthesopathy of right foot: Secondary | ICD-10-CM

## 2021-09-25 MED ORDER — TRAMADOL HCL 50 MG PO TABS
50.0000 mg | ORAL_TABLET | Freq: Three times a day (TID) | ORAL | 0 refills | Status: DC | PRN
  Filled 2021-09-25: qty 10, 4d supply, fill #0

## 2021-09-25 MED ORDER — TRAMADOL HCL 50 MG PO TABS: 50.0000 mg | ORAL_TABLET | Freq: Three times a day (TID) | ORAL | 0 refills | Status: AC | PRN

## 2021-09-25 MED ORDER — TRIAMCINOLONE ACETONIDE 10 MG/ML IJ SUSP
10.0000 mg | Freq: Once | INTRAMUSCULAR | Status: AC
  Administered 2021-09-25: 10 mg

## 2021-09-25 NOTE — Telephone Encounter (Signed)
Called patient, no answer, left vmessage that medication has been sent to pharmacy on file

## 2021-09-25 NOTE — Telephone Encounter (Signed)
Patient is calling and wants her prescription sent to another pharmacy, [facility] [address]

## 2021-09-25 NOTE — Patient Instructions (Signed)

## 2021-09-28 NOTE — Progress Notes (Signed)
Subjective: 41 year old female presents the office today for concerns of right ankle pain.  She states that she has been doing great but about 2 weeks ago the right ankle started to hurt more.  She is asking if she is able to have another steroid injection.  Overall since starting methotrexate she has been feeling better in general except for the ankle still bothering her.  She denies recent injury or trauma.  She states that they had discussed starting low-dose oral steroid as well if needed.  Objective: AAO x3, NAD DP/PT pulses palpable bilaterally, CRT less than 3 seconds There is tenderness palpation on the anterior medial, anterior lateral ankle joint on the right side.  There is no significant edema.  She does get tenderness on the flexor tendons on the medial and lateral aspect of the ankle as well.  No specific area of pinpoint tenderness.  There is no crepitation or restriction with ankle range of motion. No pain with calf compression, swelling, warmth, erythema  Assessment: Capsulitis right ankle  Plan: -All treatment options discussed with the patient including all alternatives, risks, complications.  -We discussed repeat steroid injection discussed risks of injections.  She understands this and wishes to proceed.  Skin was prepped with Betadine and a mixture of 1 cc Kenalog 10, 0.5 cc of Marcaine plain, 0.5 cc of lidocaine plain was infiltrated along the anteromedial ankle joint. She tolerated well without any complications. Post-injection care discussed.  -She gets pain after injections so tramadol was prescribed.  -Continue medications as prescribed. If no improvement would consider adding a  low dose steroid (per rheumatology).  -Patient encouraged to call the office with any questions, concerns, change in symptoms.   Trula Slade DPM

## 2021-10-21 ENCOUNTER — Other Ambulatory Visit (HOSPITAL_COMMUNITY): Payer: Self-pay

## 2021-11-02 ENCOUNTER — Encounter: Payer: Self-pay | Admitting: Podiatry

## 2021-11-02 NOTE — Telephone Encounter (Signed)
Please advise 

## 2021-11-06 ENCOUNTER — Other Ambulatory Visit (HOSPITAL_COMMUNITY): Payer: Self-pay

## 2021-11-06 ENCOUNTER — Other Ambulatory Visit: Payer: Self-pay | Admitting: Podiatry

## 2021-11-06 MED ORDER — METHYLPREDNISOLONE 4 MG PO TBPK
ORAL_TABLET | ORAL | 0 refills | Status: DC
  Filled 2021-11-06: qty 21, 6d supply, fill #0

## 2021-11-10 ENCOUNTER — Telehealth: Payer: 59 | Admitting: Family Medicine

## 2021-11-19 ENCOUNTER — Ambulatory Visit: Payer: 59 | Admitting: Rheumatology

## 2021-11-19 DIAGNOSIS — M25579 Pain in unspecified ankle and joints of unspecified foot: Secondary | ICD-10-CM

## 2021-11-19 DIAGNOSIS — M958 Other specified acquired deformities of musculoskeletal system: Secondary | ICD-10-CM

## 2021-11-19 DIAGNOSIS — M5136 Other intervertebral disc degeneration, lumbar region: Secondary | ICD-10-CM

## 2021-11-19 DIAGNOSIS — Z3041 Encounter for surveillance of contraceptive pills: Secondary | ICD-10-CM

## 2021-11-19 DIAGNOSIS — R768 Other specified abnormal immunological findings in serum: Secondary | ICD-10-CM

## 2021-11-19 DIAGNOSIS — Z79899 Other long term (current) drug therapy: Secondary | ICD-10-CM

## 2021-11-19 DIAGNOSIS — F411 Generalized anxiety disorder: Secondary | ICD-10-CM

## 2021-11-19 DIAGNOSIS — M249 Joint derangement, unspecified: Secondary | ICD-10-CM

## 2021-11-19 DIAGNOSIS — M542 Cervicalgia: Secondary | ICD-10-CM

## 2021-11-19 DIAGNOSIS — I10 Essential (primary) hypertension: Secondary | ICD-10-CM

## 2021-11-19 DIAGNOSIS — M0609 Rheumatoid arthritis without rheumatoid factor, multiple sites: Secondary | ICD-10-CM

## 2021-11-30 ENCOUNTER — Other Ambulatory Visit: Payer: Self-pay | Admitting: Podiatry

## 2021-11-30 DIAGNOSIS — M19071 Primary osteoarthritis, right ankle and foot: Secondary | ICD-10-CM

## 2021-12-05 ENCOUNTER — Ambulatory Visit (INDEPENDENT_AMBULATORY_CARE_PROVIDER_SITE_OTHER): Payer: 59

## 2021-12-05 DIAGNOSIS — Z9889 Other specified postprocedural states: Secondary | ICD-10-CM | POA: Diagnosis not present

## 2021-12-05 DIAGNOSIS — M19071 Primary osteoarthritis, right ankle and foot: Secondary | ICD-10-CM | POA: Diagnosis not present

## 2021-12-05 DIAGNOSIS — M25571 Pain in right ankle and joints of right foot: Secondary | ICD-10-CM | POA: Diagnosis not present

## 2021-12-07 ENCOUNTER — Telehealth: Payer: Self-pay | Admitting: *Deleted

## 2021-12-07 NOTE — Telephone Encounter (Signed)
Patient is calling and wanted to let Dr Ardelle AntonWagoner know that she received her MRI results from the radiologist over the week end,is still in a lot of pain and has resumed wearing her boot, wanted to verify that is ok and would like to know the next step moving forward with her care. Please advise. ?

## 2021-12-09 ENCOUNTER — Other Ambulatory Visit: Payer: Self-pay | Admitting: Podiatry

## 2021-12-09 DIAGNOSIS — G8929 Other chronic pain: Secondary | ICD-10-CM

## 2021-12-09 MED ORDER — TRAMADOL HCL 50 MG PO TABS
50.0000 mg | ORAL_TABLET | Freq: Three times a day (TID) | ORAL | 0 refills | Status: DC | PRN
  Filled 2021-12-09: qty 15, 5d supply, fill #0

## 2021-12-10 ENCOUNTER — Other Ambulatory Visit (HOSPITAL_COMMUNITY): Payer: Self-pay

## 2021-12-10 NOTE — Telephone Encounter (Signed)
Please contact patient to schedule f/u appointment

## 2021-12-14 ENCOUNTER — Other Ambulatory Visit: Payer: Self-pay | Admitting: Podiatry

## 2021-12-14 DIAGNOSIS — M958 Other specified acquired deformities of musculoskeletal system: Secondary | ICD-10-CM

## 2021-12-14 NOTE — Telephone Encounter (Signed)
LMOM to call back to schedule appt with Dr Ardelle AntonWagoner .

## 2021-12-17 ENCOUNTER — Encounter: Payer: Self-pay | Admitting: Physical Medicine & Rehabilitation

## 2021-12-23 ENCOUNTER — Other Ambulatory Visit (HOSPITAL_COMMUNITY): Payer: Self-pay

## 2021-12-24 ENCOUNTER — Other Ambulatory Visit: Payer: Self-pay | Admitting: Podiatry

## 2021-12-24 ENCOUNTER — Other Ambulatory Visit (HOSPITAL_COMMUNITY): Payer: Self-pay

## 2021-12-24 MED ORDER — TRAMADOL HCL 50 MG PO TABS
50.0000 mg | ORAL_TABLET | Freq: Three times a day (TID) | ORAL | 0 refills | Status: DC | PRN
  Filled 2021-12-24: qty 15, 5d supply, fill #0

## 2021-12-24 NOTE — Telephone Encounter (Signed)
Please advise 

## 2021-12-25 ENCOUNTER — Ambulatory Visit: Payer: 59 | Admitting: Podiatry

## 2022-01-10 ENCOUNTER — Other Ambulatory Visit: Payer: Self-pay | Admitting: Podiatry

## 2022-01-10 ENCOUNTER — Other Ambulatory Visit: Payer: Self-pay | Admitting: Rheumatology

## 2022-01-10 DIAGNOSIS — M1990 Unspecified osteoarthritis, unspecified site: Secondary | ICD-10-CM

## 2022-01-10 DIAGNOSIS — Z79899 Other long term (current) drug therapy: Secondary | ICD-10-CM

## 2022-01-11 ENCOUNTER — Other Ambulatory Visit (HOSPITAL_COMMUNITY): Payer: Self-pay

## 2022-01-11 MED ORDER — METHOTREXATE 2.5 MG PO TABS
20.0000 mg | ORAL_TABLET | ORAL | 0 refills | Status: DC
  Filled 2022-01-11: qty 32, 28d supply, fill #0

## 2022-01-11 MED ORDER — TRAMADOL HCL 50 MG PO TABS
50.0000 mg | ORAL_TABLET | Freq: Three times a day (TID) | ORAL | 0 refills | Status: DC | PRN
  Filled 2022-01-11: qty 15, 5d supply, fill #0

## 2022-01-11 NOTE — Telephone Encounter (Signed)
Please clarify if the patient is on injectable or oral methotrexate

## 2022-01-11 NOTE — Telephone Encounter (Signed)
Next Visit: Due March 2023 ? ?Last Visit: 09/15/2021 ? ?Last Fill: 09/24/2021 ? ?DX: Rheumatoid arthritis of multiple sites with negative rheumatoid factor  ? ?Current Dose per office note 09/15/2021: Otrexup 20 mg sq injections once weekly ? ?Labs: 09/15/2021 CBC and CMP WNL ? ?Left message to advise patient she is due to update labs and due for a follow up visit.  ? ?Okay to refill MTX?  ?

## 2022-01-12 ENCOUNTER — Other Ambulatory Visit (HOSPITAL_COMMUNITY): Payer: Self-pay

## 2022-01-18 ENCOUNTER — Encounter: Payer: Self-pay | Admitting: Physician Assistant

## 2022-01-18 ENCOUNTER — Ambulatory Visit: Payer: 59 | Admitting: Physician Assistant

## 2022-01-18 ENCOUNTER — Other Ambulatory Visit (HOSPITAL_COMMUNITY): Payer: Self-pay

## 2022-01-18 VITALS — BP 120/71 | HR 106 | Ht 65.0 in | Wt 210.0 lb

## 2022-01-18 DIAGNOSIS — N921 Excessive and frequent menstruation with irregular cycle: Secondary | ICD-10-CM | POA: Diagnosis not present

## 2022-01-18 DIAGNOSIS — N938 Other specified abnormal uterine and vaginal bleeding: Secondary | ICD-10-CM

## 2022-01-18 MED ORDER — MEDROXYPROGESTERONE ACETATE 10 MG PO TABS
10.0000 mg | ORAL_TABLET | Freq: Every day | ORAL | 0 refills | Status: DC
Start: 2022-01-18 — End: 2022-01-29
  Filled 2022-01-18: qty 5, 5d supply, fill #0

## 2022-01-18 NOTE — Progress Notes (Signed)
? ?Acute Office Visit ? ?Subjective:  ? ?  ?Patient ID: Rachel Levy, female    DOB: Oct 10, 1980, 41 y.o.   MRN: [id number] ? ?Chief Complaint  ?Patient presents with  ? Follow-up  ? ? ?HPI ?Patient is in today for heavy bleeding for the last 10 days. Pt has hx of HTN, RA. This bleeding started 3-4 days after her regular cycle. This has not happened before and she is normally regular. She is changing her tampon once every 3 hours. She does feel "crampy". She is on OCP. No medications changes. PAP is UTD with no past concerns. She is starting to feel "weak" from bleeding so much. Denies any dysuria, fever, chills, nausea, vomiting, diarrhea, constipation.  ? ?.. ?Active Ambulatory Problems  ?  Diagnosis Date Noted  ? Breakthrough bleeding associated with intrauterine device (IUD) 11/17/2017  ? Phlebolith 09/29/2018  ? Osteochondral defect of talus 10/01/2018  ? Right lumbar radiculitis 10/01/2018  ? GAD (generalized anxiety disorder) 02/08/2019  ? Well adult exam 02/08/2019  ? Hypertension goal BP (blood pressure) < 130/80 03/15/2019  ? Oral contraceptive pill surveillance 04/19/2019  ? Epiphora, right 04/19/2019  ? Deviated nasal septum 04/19/2019  ? Calculus of gallbladder without cholecystitis without obstruction 12/13/2019  ? Polyarthralgia 04/13/2021  ? Menorrhagia with irregular cycle 01/19/2022  ? DUB (dysfunctional uterine bleeding) 01/19/2022  ? ?Resolved Ambulatory Problems  ?  Diagnosis Date Noted  ? Elevated blood pressure reading 02/08/2019  ? Acute maxillary sinusitis 05/20/2020  ? ?Past Medical History:  ?Diagnosis Date  ? Anxiety   ? ? ?ROS ? ?See HPI.  ?   ?Objective:  ?  ?BP 120/71   Pulse (!) 106   Ht 5\' 5"  (1.651 m)   Wt 210 lb (95.3 kg)   SpO2 97%   BMI 34.95 kg/m?  ?BP Readings from Last 3 Encounters:  ?01/18/22 120/71  ?09/15/21 (!) 149/86  ?07/01/21 135/80  ? ?Wt Readings from Last 3 Encounters:  ?01/18/22 210 lb (95.3 kg)  ?09/15/21 216 lb (98 kg)  ?07/01/21 211 lb 11.2 oz (96 kg)  ? ?   ? ?Physical Exam ?Vitals reviewed.  ?Constitutional:   ?   Appearance: Normal appearance.  ?HENT:  ?   Head: Normocephalic.  ?Neck:  ?   Vascular: No carotid bruit.  ?Cardiovascular:  ?   Rate and Rhythm: Regular rhythm. Tachycardia present.  ?   Pulses: Normal pulses.  ?Pulmonary:  ?   Effort: Pulmonary effort is normal.  ?Abdominal:  ?   General: There is no distension.  ?   Palpations: Abdomen is soft. There is no mass.  ?   Tenderness: There is no abdominal tenderness. There is no right CVA tenderness, left CVA tenderness, guarding or rebound.  ?   Hernia: No hernia is present.  ?Musculoskeletal:  ?   Cervical back: Normal range of motion and neck supple. No tenderness.  ?   Right lower leg: No edema.  ?   Left lower leg: No edema.  ?Lymphadenopathy:  ?   Cervical: No cervical adenopathy.  ?Skin: ?   Coloration: Skin is pale.  ?Neurological:  ?   General: No focal deficit present.  ?   Mental Status: She is alert and oriented to person, place, and time.  ?Psychiatric:     ?   Mood and Affect: Mood normal.  ? ? ?Results for orders placed or performed in visit on 01/18/22  ?CBC w/Diff/Platelet  ?Result Value Ref Range  ? WBC  8.0 3.8 - 10.8 Thousand/uL  ? RBC 4.12 3.80 - 5.10 Million/uL  ? Hemoglobin 13.1 11.7 - 15.5 g/dL  ? HCT 38.2 35.0 - 45.0 %  ? MCV 92.7 80.0 - 100.0 fL  ? MCH 31.8 27.0 - 33.0 pg  ? MCHC 34.3 32.0 - 36.0 g/dL  ? RDW 13.5 11.0 - 15.0 %  ? Platelets 339 140 - 400 Thousand/uL  ? MPV 10.3 7.5 - 12.5 fL  ? Neutro Abs 2,992 1,500 - 7,800 cells/uL  ? Lymphs Abs 4,096 (H) 850 - 3,900 cells/uL  ? Absolute Monocytes 592 200 - 950 cells/uL  ? Eosinophils Absolute 248 15 - 500 cells/uL  ? Basophils Absolute 72 0 - 200 cells/uL  ? Neutrophils Relative % 37.4 %  ? Total Lymphocyte 51.2 %  ? Monocytes Relative 7.4 %  ? Eosinophils Relative 3.1 %  ? Basophils Relative 0.9 %  ?TSH  ?Result Value Ref Range  ? TSH 1.45 mIU/L  ?Fe+TIBC+Fer  ?Result Value Ref Range  ? Ferritin 105 16 - 154 ng/mL  ?FSH/LH  ?Result  Value Ref Range  ? FSH 11.7 mIU/mL  ? LH 4.1 mIU/mL  ?Estradiol  ?Result Value Ref Range  ? Estradiol 27 pg/mL  ? ? ? ?   ?Assessment & Plan:  ?..Rachel Levy was seen today for follow-up. ? ?Diagnoses and all orders for this visit: ? ?DUB (dysfunctional uterine bleeding) ?-     US Pelvic Complete With Transvaginal; Future ?-     CBC w/Diff/Platelet ?-     TSH ?-     COMPLETE METABOLIC PANEL WITH GFR ?-     Fe+TIBC+Fer ?-     medroxyPROGESTERone (PROVERA) 10 MG tablet; Take 1 tablet (10 mg total) by mouth daily for 5 days. ?-     FSH/LH ?-     Estradiol ? ?Menorrhagia with irregular cycle ?-     US Pelvic Complete With Transvaginal; Future ?-     CBC w/Diff/Platelet ?-     TSH ?-     COMPLETE METABOLIC PANEL WITH GFR ?-     Fe+TIBC+Fer ?-     medroxyPROGESTERone (PROVERA) 10 MG tablet; Take 1 tablet (10 mg total) by mouth daily for 5 days. ?-     FSH/LH ?-     Estradiol ? ? ?Unclear etiology.  ?Continue on OCP added provera for the next 5 days to stop bleeding ?CBC/FSH/Estradiol/TSH/CMP ordered today ?Pelvic u/s ordered ?HO given on DUB/menorrhagia ?Will call with results.  ? ? ?Return in about 2 weeks (around 02/01/2022), or if symptoms worsen or fail to improve. ? ?Iran Planas, PA-C ? ? ?

## 2022-01-18 NOTE — Patient Instructions (Signed)
Start provera for 5 days ?Get pelvic ultrasound ?Get labs ? ?Dysfunctional Uterine Bleeding ?Dysfunctional uterine bleeding is abnormal bleeding from the uterus. Dysfunctional uterine bleeding includes: ?A menstrual period that comes earlier or later than usual. ?A menstrual period that is lighter or heavier than usual, or has large blood clots. ?Vaginal bleeding between menstrual periods. ?Skipping one or more menstrual periods. ?Vaginal bleeding after sex. ?Vaginal bleeding after menopause. ?Follow these instructions at home: ?Eating and drinking ? ?Eat well-balanced meals. Include foods that are high in iron, such as liver, meat, shellfish, green leafy vegetables, and eggs. ?To prevent or treat constipation, your health care provider may recommend that you: ?Drink enough fluid to keep your urine pale yellow. ?Take over-the-counter or prescription medicines. ?Eat foods that are high in fiber, such as beans, whole grains, and fresh fruits and vegetables. ?Limit foods that are high in fat and processed sugars, such as fried or sweet foods. ?Medicines ?Take over-the-counter and prescription medicines only as told by your health care provider. ?Do not change medicines without talking with your health care provider. ?Aspirin or medicines that contain aspirin may make the bleeding worse. Do not take those medicines: ?During the week before your menstrual period. ?During your menstrual period. ?If you were prescribed iron pills, take them as told by your health care provider. Iron pills help to replace iron that your body loses because of this condition. ?Activity ?If you need to change your sanitary pad or tampon more than one time every 2 hours: ?Lie in bed with your feet raised (elevated). ?Place a cold pack on your lower abdomen. ?Rest as much as possible until the bleeding stops or slows down. ?Do not try to lose weight until the bleeding has stopped and your blood iron level is back to normal. ?General  instructions ? ?For two months, write down: ?When your menstrual period starts. ?When your menstrual period ends. ?When any abnormal vaginal bleeding occurs. ?What problems you notice. ?Keep all follow up visits as told by your health care provider. This is important. ?Contact a health care provider if you: ?Feel light-headed or weak. ?Have nausea and vomiting. ?Cannot eat or drink without vomiting. ?Feel dizzy or have diarrhea while you are taking medicines. ?Are taking birth control pills or hormones, and you want to change them or stop taking them. ?Get help right away if: ?You develop a fever or chills. ?You need to change your sanitary pad or tampon more than one time per hour. ?Your vaginal bleeding becomes heavier, or your flow contains clots more often. ?You develop pain in your abdomen. ?You lose consciousness. ?You develop a rash. ?Summary ?Dysfunctional uterine bleeding is abnormal bleeding from the uterus. ?It includes menstrual bleeding of abnormal duration, volume, or regularity. ?Bleeding after sex and after menopause are also considered dysfunctional uterine bleeding. ?This information is not intended to replace advice given to you by your health care provider. Make sure you discuss any questions you have with your health care provider. ?Document Revised: 02/01/2018 Document Reviewed: 02/01/2018 ?Elsevier Patient Education ? 2022 Elsevier Inc. ? ?

## 2022-01-19 ENCOUNTER — Ambulatory Visit: Payer: 59 | Admitting: Physician Assistant

## 2022-01-19 ENCOUNTER — Encounter: Payer: Self-pay | Admitting: Physician Assistant

## 2022-01-19 ENCOUNTER — Other Ambulatory Visit (HOSPITAL_COMMUNITY): Payer: Self-pay

## 2022-01-19 DIAGNOSIS — N938 Other specified abnormal uterine and vaginal bleeding: Secondary | ICD-10-CM | POA: Insufficient documentation

## 2022-01-19 DIAGNOSIS — N921 Excessive and frequent menstruation with irregular cycle: Secondary | ICD-10-CM | POA: Insufficient documentation

## 2022-01-19 LAB — COMPLETE METABOLIC PANEL WITH GFR
AG Ratio: 1.4 (calc) (ref 1.0–2.5)
ALT: 26 U/L (ref 6–29)
AST: 27 U/L (ref 10–30)
Albumin: 4.2 g/dL (ref 3.6–5.1)
Alkaline phosphatase (APISO): 66 U/L (ref 31–125)
BUN: 12 mg/dL (ref 7–25)
CO2: 27 mmol/L (ref 20–32)
Calcium: 10.2 mg/dL (ref 8.6–10.2)
Chloride: 106 mmol/L (ref 98–110)
Creat: 0.85 mg/dL (ref 0.50–0.99)
Globulin: 2.9 g/dL (calc) (ref 1.9–3.7)
Glucose, Bld: 108 mg/dL (ref 65–139)
Potassium: 3.9 mmol/L (ref 3.5–5.3)
Sodium: 142 mmol/L (ref 135–146)
Total Bilirubin: 0.2 mg/dL (ref 0.2–1.2)
Total Protein: 7.1 g/dL (ref 6.1–8.1)
eGFR: 89 mL/min/{1.73_m2} (ref >=60)

## 2022-01-19 LAB — CBC WITH DIFFERENTIAL/PLATELET
Absolute Monocytes: 592 cells/uL (ref 200–950)
Basophils Absolute: 72 cells/uL (ref 0–200)
Basophils Relative: 0.9 %
Eosinophils Absolute: 248 cells/uL (ref 15–500)
Eosinophils Relative: 3.1 %
HCT: 38.2 % (ref 35.0–45.0)
Hemoglobin: 13.1 g/dL (ref 11.7–15.5)
Lymphs Abs: 4096 cells/uL — ABNORMAL HIGH (ref 850–3900)
MCH: 31.8 pg (ref 27.0–33.0)
MCHC: 34.3 g/dL (ref 32.0–36.0)
MCV: 92.7 fL (ref 80.0–100.0)
MPV: 10.3 fL (ref 7.5–12.5)
Monocytes Relative: 7.4 %
Neutro Abs: 2992 cells/uL (ref 1500–7800)
Neutrophils Relative %: 37.4 %
Platelets: 339 10*3/uL (ref 140–400)
RBC: 4.12 10*6/uL (ref 3.80–5.10)
RDW: 13.5 % (ref 11.0–15.0)
Total Lymphocyte: 51.2 %
WBC: 8 10*3/uL (ref 3.8–10.8)

## 2022-01-19 LAB — IRON,TIBC AND FERRITIN PANEL
%SAT: 17 % (calc) (ref 16–45)
Ferritin: 105 ng/mL (ref 16–154)
Iron: 64 ug/dL (ref 40–190)
TIBC: 385 mcg/dL (calc) (ref 250–450)

## 2022-01-19 LAB — FSH/LH
FSH: 11.7 m[IU]/mL
LH: 4.1 m[IU]/mL

## 2022-01-19 LAB — TSH: TSH: 1.45 mIU/L

## 2022-01-19 LAB — ESTRADIOL: Estradiol: 27 pg/mL

## 2022-01-19 NOTE — Progress Notes (Signed)
Thyroid looks normal and stable from last check 1 year ago.  ?FSH normal range.  ?Estradiol low normal.  ?WBC looks great ?Hemoglobin normal ?Lymphs elevated but hx of this

## 2022-01-22 NOTE — Progress Notes (Unsigned)
Office Visit Note  Patient: Rachel Levy             Date of Birth: August 02, 1981           MRN: [id number]             PCP: Everrett Coombe, DO Referring: Everrett Coombe, DO Visit Date: 02/05/2022 Occupation: @GUAROCC @  Subjective:  Medication monitoring   History of Present Illness: Rachel Levy is a 41 y.o. female with history of seronegative rheumatoid arthritis. She is taking methotrexate 8 tablets by mouth once weekly and folic acid 2 mg daily.  She is tolerating methotrexate without any side effects and has not missed any doses recently.  She denies any signs or symptoms of a rheumatoid arthritis flare.  She has not been experiencing any morning stiffness or nocturnal pain.  She denies any difficulty with ADLs.  She continues to have chronic right ankle pain s/p surgery by Dr. Ardelle Anton.  She is planning on establishing care with an orthopedist in [place].   She denies any recent infections.  She denies any new medical conditions since her last office visit.  She states that her and her husband are in the process of planning to move to [place], [place].    Activities of Daily Living:  Patient reports morning stiffness for 0 minutes.   Patient Reports nocturnal pain.  Difficulty dressing/grooming: Denies Difficulty climbing stairs: Reports Difficulty getting out of chair: Denies Difficulty using hands for taps, buttons, cutlery, and/or writing: Denies  Review of Systems  Constitutional:  Negative for fatigue.  HENT:  Negative for mouth sores, mouth dryness and nose dryness.   Eyes:  Negative for pain, itching and dryness.  Respiratory:  Negative for shortness of breath and difficulty breathing.   Cardiovascular:  Negative for chest pain and palpitations.  Gastrointestinal:  Negative for blood in stool, constipation and diarrhea.  Endocrine: Negative for increased urination.  Genitourinary:  Negative for difficulty urinating.  Musculoskeletal:  Positive for joint pain, joint  pain and joint swelling. Negative for myalgias, morning stiffness, muscle tenderness and myalgias.  Skin:  Negative for color change, rash and redness.  Allergic/Immunologic: Positive for susceptible to infections.  Neurological:  Negative for dizziness, numbness, headaches, memory loss and weakness.  Hematological:  Positive for bruising/bleeding tendency.  Psychiatric/Behavioral:  Negative for confusion.    PMFS History:  Patient Active Problem List   Diagnosis Date Noted   Menorrhagia with irregular cycle 01/19/2022   DUB (dysfunctional uterine bleeding) 01/19/2022   Polyarthralgia 04/13/2021   Calculus of gallbladder without cholecystitis without obstruction 12/13/2019   Oral contraceptive pill surveillance 04/19/2019   Epiphora, right 04/19/2019   Deviated nasal septum 04/19/2019   Hypertension goal BP (blood pressure) < 130/80 03/15/2019   GAD (generalized anxiety disorder) 02/08/2019   Well adult exam 02/08/2019   Osteochondral defect of talus 10/01/2018   Right lumbar radiculitis 10/01/2018   Phlebolith 09/29/2018   Breakthrough bleeding associated with intrauterine device (IUD) 11/17/2017    Past Medical History:  Diagnosis Date   Anxiety     Family History  Problem Relation Age of Onset   Hyperlipidemia Mother    Arthritis Mother    Diabetes Father    Healthy Sister    Hyperlipidemia Maternal Aunt    Hyperlipidemia Maternal Uncle    Diabetes Maternal Grandfather    Past Surgical History:  Procedure Laterality Date   ANKLE SURGERY Right    CHOLECYSTECTOMY     NASAL SEPTUM SURGERY  Social History   Social History Narrative   Not on file   Immunization History  Administered Date(s) Administered   Influenza,inj,Quad PF,6+ Mos 10/20/2017, 09/27/2018, 07/18/2019, 06/17/2020, 07/01/2021   PFIZER(Purple Top)SARS-COV-2 Vaccination 12/07/2019, 12/28/2019   PNEUMOCOCCAL CONJUGATE-20 07/01/2021   Tdap 09/06/2014     Objective: Vital Signs: BP (!) 156/93 (BP  Location: Left Arm, Patient Position: Sitting, Cuff Size: Normal)   Pulse 89   Ht 5\' 4"  (1.626 m)   Wt 209 lb 3.2 oz (94.9 kg)   BMI 35.91 kg/m    Physical Exam Vitals and nursing note reviewed.  Constitutional:      Appearance: She is well-developed.  HENT:     Head: Normocephalic and atraumatic.  Eyes:     Conjunctiva/sclera: Conjunctivae normal.  Cardiovascular:     Rate and Rhythm: Normal rate and regular rhythm.     Heart sounds: Normal heart sounds.  Pulmonary:     Effort: Pulmonary effort is normal.     Breath sounds: Normal breath sounds.  Abdominal:     General: Bowel sounds are normal.     Palpations: Abdomen is soft.  Musculoskeletal:     Cervical back: Normal range of motion.  Skin:    General: Skin is warm and dry.     Capillary Refill: Capillary refill takes less than 2 seconds.  Neurological:     Mental Status: She is alert and oriented to person, place, and time.  Psychiatric:        Behavior: Behavior normal.     Musculoskeletal Exam: C-spine has good range of motion.  No midline spinal tenderness.  Some tenderness over the right SI joint.  Shoulder joints, elbow joints, wrist joints, MCPs, PIPs, DIPs have good range of motion with no synovitis.  Complete fist formation bilaterally.  Hip joints have good range of motion with no groin pain.  Knee joints have good range of motion with no warmth or effusion.  Painful range of motion of the right ankle joint with tenderness and warmth.  Left ankle joint has good range of motion with no tenderness or joint swelling.  CDAI Exam: CDAI Score: 0.2  Patient Global: 1 mm; Provider Global: 1 mm Swollen: 0 ; Tender: 0  Joint Exam 02/05/2022   No joint exam has been documented for this visit   There is currently no information documented on the homunculus. Go to the Rheumatology activity and complete the homunculus joint exam.  Investigation: No additional findings.  Imaging: No results found.  Recent  Labs: Lab Results  Component Value Date   WBC 8.0 01/18/2022   HGB 13.1 01/18/2022   PLT 339 01/18/2022   NA 142 01/18/2022   K 3.9 01/18/2022   CL 106 01/18/2022   CO2 27 01/18/2022   GLUCOSE 108 01/18/2022   BUN 12 01/18/2022   CREATININE 0.85 01/18/2022   BILITOT 0.2 01/18/2022   AST 27 01/18/2022   ALT 26 01/18/2022   PROT 7.1 01/18/2022   CALCIUM 10.2 01/18/2022   GFRAA 115 04/19/2019   QFTBGOLDPLUS NEGATIVE 05/25/2021    Speciality Comments: No specialty comments available.  Procedures:  No procedures performed Allergies: Penicillins, Doxycycline, Mircette [desogestrel-ethinyl estradiol], and Nuvaring [etonogestrel-ethinyl estradiol]   Assessment / Plan:     Visit Diagnoses: Rheumatoid arthritis of multiple sites with negative rheumatoid factor (HCC): She has no synovitis on examination today.  She has not had any signs or symptoms of a rheumatoid arthritis flare.  She has clinically been doing well taking methotrexate  8 tablets by mouth once weekly and folic acid 2 mg daily.  She has been tolerating methotrexate without any side effects and has not missed any doses recently.  She has not had any recent infections.  No morning stiffness or nocturnal pain.  She has not had any difficulty with ADLs.  She has chronic pain in her right ankle status post surgery performed by Dr. Loreta Ave.  She is planning on establishing care with an orthopedist when she moves to [facility].  She was advised to notify us once she has establish care with a rheumatologist and we can transfer records. She will remain on methotrexate and folic acid as prescribed.  A refill of methotrexate was sent to the pharmacy today.  She will follow-up in 5 months or sooner if needed prior to her move.  Positive anti-CCP test - May 01, 2021 Anti-CCP 33-weak positive  High risk medication use - Methotrexate 8 tablets by mouth once weekly and folic acid 2 mg by mouth daily. CBC and CMP updated on 01/18/22.   Results were reviewed today in the office.  She will be due to update lab work in August and every 3 months.  Set of orders for CBC and CMP remain in place. She has not had any recent infections.  Discussed the importance of holding methotrexate if she develops signs or symptoms of an infection and to resume once the infection has completely cleared.   Osteochondral defect of talus - Right ankle-S/p surgery by Dr. Ardelle Anton. Chronic pain. Her pain level ranges from a 5-8 out of 10 on a daily basis.  She has ongoing joint swelling.  She will be establishing care with an orthopedist in [place].    Neck pain - History of DDD based on MRI findings per patient. She has good ROM with no discomfort at this time.   DDD (degenerative disc disease), lumbar - With intermittent right sided radiculitis.  No midline spinal tenderness at this time.   Hypermobility of joint  GAD (generalized anxiety disorder)  Hypertension goal BP (blood pressure) < 130/80   Orders: No orders of the defined types were placed in this encounter.  Meds ordered this encounter  Medications   methotrexate (RHEUMATREX) 2.5 MG tablet    Sig: Take 8 tablets (20 mg total) by mouth once a week.    Dispense:  96 tablet    Refill:  0      Follow-Up Instructions: Return in 5 months (on 07/08/2022) for Rheumatoid arthritis.   Gearldine Bienenstock, PA-C  Note - This record has been created using Dragon software.  Chart creation errors have been sought, but may not always  have been located. Such creation errors do not reflect on  the standard of medical care.

## 2022-01-24 ENCOUNTER — Other Ambulatory Visit: Payer: Self-pay | Admitting: Osteopathic Medicine

## 2022-01-24 DIAGNOSIS — Z30011 Encounter for initial prescription of contraceptive pills: Secondary | ICD-10-CM

## 2022-01-25 ENCOUNTER — Other Ambulatory Visit (HOSPITAL_COMMUNITY): Payer: Self-pay

## 2022-01-25 MED ORDER — NORETHIN ACE-ETH ESTRAD-FE 1-20 MG-MCG PO TABS
1.0000 | ORAL_TABLET | Freq: Every day | ORAL | 0 refills | Status: DC
  Filled 2022-01-25: qty 84, 84d supply, fill #0

## 2022-01-29 ENCOUNTER — Encounter: Payer: Self-pay | Admitting: Physical Medicine & Rehabilitation

## 2022-01-29 ENCOUNTER — Encounter: Payer: 59 | Attending: Physical Medicine & Rehabilitation | Admitting: Physical Medicine & Rehabilitation

## 2022-01-29 VITALS — BP 141/91 | HR 125 | Ht 65.0 in | Wt 211.2 lb

## 2022-01-29 DIAGNOSIS — G8929 Other chronic pain: Secondary | ICD-10-CM | POA: Insufficient documentation

## 2022-01-29 DIAGNOSIS — M069 Rheumatoid arthritis, unspecified: Secondary | ICD-10-CM | POA: Diagnosis not present

## 2022-01-29 DIAGNOSIS — M25571 Pain in right ankle and joints of right foot: Secondary | ICD-10-CM | POA: Insufficient documentation

## 2022-01-29 MED ORDER — DULOXETINE HCL 30 MG PO CPEP: 30.0000 mg | ORAL_CAPSULE | Freq: Every day | ORAL | 2 refills | Status: AC

## 2022-01-29 NOTE — Progress Notes (Unsigned)
Subjective:    Patient ID: Rachel Levy, female    DOB: 22-Jan-1981, 41 y.o.   MRN: [id number]  HPI  42 year old female with past medical history of rheumatoid arthritis and chronic right ankle pain here for pain management.  Patient reports that she developed pain in her ankles about 3 years ago.  She initially ignored the pain but then followed with podiatry who did several ankle joint injections with mild benefit.  Podiatry completed MRI of her ankle which showed a chondral lesion of the medial talar dome with a large defect.  Podiatry completed a right ankle medial malleolus osteotomy with osteochondral repair on 08/13/2020.  She continued to have severe pain in her ankles, lower back, shoulders.  She was given prednisone initially with improvement of her joint pain.  Patient had a work-up with rheumatology and was found to have rheumatoid arthritis.  She was started on methotrexate with improvement of most of her joint pain besides her right ankle.  She denies any hand involvement with her RA.  Her ankle pain on the right continues to bother her after standing or walking with increased swelling.  She uses ice and elevation to help the pain.  She also uses her surgical boot when the pain is severe.  Pain in the ankle is burning and achy.  She has used Tylenol, ibuprofen, oral steroids, gabapentin, Voltaren gel, Celebrex without significant benefit.  She does not know the dose of gabapentin but she does report it caused some sedation.  She did get a prescription for tramadol which does help improve the pain.  She says rheumatology advised to avoid chronic NSAID use.   Pain Inventory Average Pain 8 Pain Right Now 5 My pain is constant, burning, and aching  In the last 24 hours, has pain interfered with the following? General activity 9 Relation with others 5 Enjoyment of life 7 What TIME of day is your pain at its worst? night and varies Sleep (in general)  did sleep last night because dog  sitting for a dog that has weeks to live and howling all night  Pain is worse with: walking and standing Pain improves with: heat/ice and medication Relief from Meds: 5  walk without assistance use a cane how many minutes can you walk? 20 ability to climb steps?  yes do you drive?  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  no  Any changes since last visit?  no    Family History  Problem Relation Age of Onset   Hyperlipidemia Mother    Arthritis Mother    Diabetes Father    Healthy Sister    Hyperlipidemia Maternal Aunt    Hyperlipidemia Maternal Uncle    Diabetes Maternal Grandfather    Social History   Socioeconomic History   Marital status: Married    Spouse name: Not on file   Number of children: Not on file   Years of education: Not on file   Highest education level: Not on file  Occupational History   Occupation: receptionist  Tobacco Use   Smoking status: Former    Packs/day: 0.50    Years: 12.00    Pack years: 6.00    Types: Cigarettes    Quit date: 02/12/2012    Years since quitting: 9.9   Smokeless tobacco: Never  Vaping Use   Vaping Use:  Never used  Substance and Sexual Activity   Alcohol use: Not Currently   Drug use: Never   Sexual activity: Yes    Partners: Male    Birth control/protection: Pill  Other Topics Concern   Not on file  Social History Narrative   Not on file   Social Determinants of Health   Financial Resource Strain: Not on file  Food Insecurity: Not on file  Transportation Needs: Not on file  Physical Activity: Not on file  Stress: Not on file  Social Connections: Not on file   Past Surgical History:  Procedure Laterality Date   ANKLE SURGERY Right    CHOLECYSTECTOMY     NASAL SEPTUM SURGERY     Past Medical History:  Diagnosis Date   Anxiety    BP (!) 141/91   Pulse (!) 125  Comment: denies SOB, dizziness  Ht 5\' 5"  (1.651 m)   Wt 211 lb 3.2 oz (95.8 kg)   SpO2 95%   BMI 35.15 kg/m   Opioid Risk Score:   Fall Risk Score:  `1  Depression screen PHQ 2/9     01/29/2022    1:24 PM 07/01/2021    2:52 PM 10/10/2020    8:23 AM 07/18/2019   10:03 AM 04/19/2019    9:54 AM 03/15/2019    1:50 PM 02/08/2019    9:29 AM  Depression screen PHQ 2/9  Decreased Interest 2 0 1 0 1 1 0  Down, Depressed, Hopeless 1 0 0 0 1 0 0  PHQ - 2 Score 3 0 1 0 2 1 0  Altered sleeping 3  0  0 1 2  Tired, decreased energy 2  0  0 2 2  Change in appetite 2  0  1 0 0  Feeling bad or failure about yourself  1  0  0 0 0  Trouble concentrating 1  0  0 0 0  Moving slowly or fidgety/restless 0  1  0 0 0  Suicidal thoughts 0  0  0 0 0  PHQ-9 Score 12  2  3 4 4   Difficult doing work/chores Very difficult  Somewhat difficult  Not difficult at all       Review of Systems  Constitutional: Negative.   HENT: Negative.    Eyes: Negative.   Respiratory: Negative.    Cardiovascular: Negative.   Gastrointestinal: Negative.   Endocrine: Negative.   Genitourinary: Negative.   Musculoskeletal:  Positive for gait problem.  Skin: Negative.   Allergic/Immunologic: Negative.   Hematological: Negative.   Psychiatric/Behavioral:  Positive for dysphoric mood. The patient is nervous/anxious.   All other systems reviewed and are negative.     Objective:   Physical Exam  Gen: no distress, normal appearing HEENT: oral mucosa pink and moist, NCAT Cardio: Reg rate Chest: normal effort, normal rate of breathing Abd: soft, non-distended Ext: no edema Psych: pleasant, normal affect Skin: intact Neuro: Alert and oriented x3, sensation intact in all 4 extremities Musculoskeletal:  DP/PT palpable, diffuse ankle tenderness with moderate tenderness along medial ankle joint with milder tenderness at lateral ankle joint on the Right. Minimal medial joint swelling. Mild subtalar tenderness. No erythema or  warmth. No crepitus or pain with ankle DF or PF. Pain with ankle inversion and eversion. Left ankle with mild pain along peroneal tendons.  Wearing right ankle brace Surgical scar noted on medial right ankle Strength 5/5 in all 4 extremities No pain at the plantar fascia  Assessment & Plan:  R ankle pain with history of chondral lesion on medial talar dome s/p  right ankle medial malleolus to mid osteochondral repair  RA

## 2022-01-31 ENCOUNTER — Encounter: Payer: Self-pay | Admitting: Physical Medicine & Rehabilitation

## 2022-02-02 ENCOUNTER — Telehealth: Payer: Self-pay

## 2022-02-02 NOTE — Telephone Encounter (Signed)
Rachel Levy does not want to take the Duloxetine 30 MG. Patient stated she does not want to do anti-depressants. But would like a script for Tramadol.   Please advise or prescribe.

## 2022-02-05 ENCOUNTER — Other Ambulatory Visit (HOSPITAL_COMMUNITY): Payer: Self-pay

## 2022-02-05 ENCOUNTER — Encounter: Payer: Self-pay | Admitting: Physician Assistant

## 2022-02-05 ENCOUNTER — Ambulatory Visit (INDEPENDENT_AMBULATORY_CARE_PROVIDER_SITE_OTHER): Payer: 59 | Admitting: Physician Assistant

## 2022-02-05 VITALS — BP 156/93 | HR 89 | Ht 64.0 in | Wt 209.2 lb

## 2022-02-05 DIAGNOSIS — R768 Other specified abnormal immunological findings in serum: Secondary | ICD-10-CM

## 2022-02-05 DIAGNOSIS — M5136 Other intervertebral disc degeneration, lumbar region: Secondary | ICD-10-CM | POA: Diagnosis not present

## 2022-02-05 DIAGNOSIS — M542 Cervicalgia: Secondary | ICD-10-CM

## 2022-02-05 DIAGNOSIS — M25579 Pain in unspecified ankle and joints of unspecified foot: Secondary | ICD-10-CM

## 2022-02-05 DIAGNOSIS — M958 Other specified acquired deformities of musculoskeletal system: Secondary | ICD-10-CM

## 2022-02-05 DIAGNOSIS — Z79899 Other long term (current) drug therapy: Secondary | ICD-10-CM | POA: Diagnosis not present

## 2022-02-05 DIAGNOSIS — I10 Essential (primary) hypertension: Secondary | ICD-10-CM

## 2022-02-05 DIAGNOSIS — M0609 Rheumatoid arthritis without rheumatoid factor, multiple sites: Secondary | ICD-10-CM | POA: Diagnosis not present

## 2022-02-05 DIAGNOSIS — M249 Joint derangement, unspecified: Secondary | ICD-10-CM | POA: Diagnosis not present

## 2022-02-05 DIAGNOSIS — F411 Generalized anxiety disorder: Secondary | ICD-10-CM

## 2022-02-05 MED ORDER — METHOTREXATE 2.5 MG PO TABS
20.0000 mg | ORAL_TABLET | ORAL | 0 refills | Status: AC
Start: 2022-02-05 — End: ?
  Filled 2022-02-05: qty 96, 84d supply, fill #0

## 2022-02-05 NOTE — Patient Instructions (Signed)
Standing Labs ?We placed an order today for your standing lab work.  ? ?Please have your standing labs drawn in August and every 3 months  ? ?If possible, please have your labs drawn 2 weeks prior to your appointment so that the provider can discuss your results at your appointment. ? ?Please note that you may see your imaging and lab results in MyChart before we have reviewed them. ?We may be awaiting multiple results to interpret others before contacting you. ?Please allow our office up to 72 hours to thoroughly review all of the results before contacting the office for clarification of your results. ? ?We have open lab daily: ?Monday through Thursday from 1:30-4:30 PM and Friday from 1:30-4:00 PM ?at the office of Dr. Shaili Deveshwar, [facility].   ?Please be advised, all patients with office appointments requiring lab work will take precedent over walk-in lab work.  ?If possible, please come for your lab work on Monday and Friday afternoons, as you may experience shorter wait times. ?The office is located at [address], [address], [place], [place] [address] ?No appointment is necessary.   ?Labs are drawn by [facility]. Please bring your co-pay at the time of your lab draw.  You may receive a bill from [facility] for your lab work. ? ?Please note if you are on Hydroxychloroquine and and an order has been placed for a Hydroxychloroquine level, you will need to have it drawn 4 hours or more after your last dose. ? ?If you wish to have your labs drawn at another location, please call the office 24 hours in advance to send orders. ? ?If you have any questions regarding directions or hours of operation,  ?please call [phone].   ?As a reminder, please drink plenty of water prior to coming for your lab work. Thanks! ? ?

## 2022-02-11 ENCOUNTER — Encounter: Payer: 59 | Attending: Physical Medicine & Rehabilitation | Admitting: Physical Medicine & Rehabilitation

## 2022-02-11 ENCOUNTER — Encounter: Payer: Self-pay | Admitting: Physical Medicine & Rehabilitation

## 2022-02-11 VITALS — BP 133/85 | HR 102 | Ht 64.0 in | Wt 208.2 lb

## 2022-02-11 DIAGNOSIS — Z5181 Encounter for therapeutic drug level monitoring: Secondary | ICD-10-CM | POA: Diagnosis not present

## 2022-02-11 DIAGNOSIS — G8929 Other chronic pain: Secondary | ICD-10-CM | POA: Insufficient documentation

## 2022-02-11 DIAGNOSIS — G894 Chronic pain syndrome: Secondary | ICD-10-CM | POA: Insufficient documentation

## 2022-02-11 DIAGNOSIS — Z79891 Long term (current) use of opiate analgesic: Secondary | ICD-10-CM | POA: Insufficient documentation

## 2022-02-11 DIAGNOSIS — M25571 Pain in right ankle and joints of right foot: Secondary | ICD-10-CM | POA: Diagnosis not present

## 2022-02-11 NOTE — Progress Notes (Signed)
Subjective:    Patient ID: Rachel Levy, female    DOB: [birth year], 41 y.o.   MRN: [id number]  HPI 41 year old female with past medical history of rheumatoid arthritis and chronic right ankle pain here for pain management.  Patient reports that she developed pain in her ankles about 3 years ago.  She initially ignored the pain but then followed with podiatry who did several ankle joint injections with mild benefit.  Podiatry completed MRI of her ankle which showed a chondral lesion of the medial talar dome with a large defect.  Podiatry completed a right ankle medial malleolus osteotomy with osteochondral repair on 08/13/2020.  She continued to have severe pain in her ankles, lower back, shoulders.  She was given prednisone initially with improvement of her joint pain.  Patient had a work-up with rheumatology and was found to have rheumatoid arthritis.  She was started on methotrexate with improvement of most of her joint pain besides her right ankle.  She denies any hand involvement with her RA.  Her ankle pain on the right continues to bother her after standing or walking with increased swelling.  She uses ice and elevation to help the pain.  She also uses her surgical boot when the pain is severe.  Pain in the ankle is burning and achy.  She has used Tylenol, ibuprofen, oral steroids, gabapentin, Voltaren gel, Celebrex without significant benefit.  She does not know the dose of gabapentin but she does report it caused some sedation.  She did get a prescription for tramadol which does help improve the pain.  She says rheumatology advised to avoid chronic NSAID use.  She reports her mood is okay overall but sometimes the pain does get her down.    Interval history Rachel Levy is here for follow-up of her chronic right ankle pain.  She reports her pain has been unchanged from the last visit.  The pain throughout her body in other joints continues to be greatly improved since she started the  methotrexate.  Patient reports she had a hard time getting off an antidepressant in the past and is very hesitant to start duloxetine.  She has used tramadol without any side effects.  She only uses it a few times a week when pain is severe.  She reports her mood is good.   Pain Inventory Average Pain 6 Pain Right Now 5 My pain is burning and aching  In the last 24 hours, has pain interfered with the following? General activity 6 Relation with others 0 Enjoyment of life 2 What TIME of day is your pain at its worst? evening Sleep (in general) Fair  Pain is worse with: walking, standing, and some activites Pain improves with: rest, heat/ice, and medication Relief from Meds: 5  Family History  Problem Relation Age of Onset   Hyperlipidemia Mother    Arthritis Mother    Diabetes Father    Healthy Sister    Hyperlipidemia Maternal Aunt    Hyperlipidemia Maternal Uncle    Diabetes Maternal Grandfather    Social History   Socioeconomic History   Marital status: Married    Spouse name: Not on file   Number of children: Not on file   Years of education: Not on file   Highest education level: Not on file  Occupational History   Occupation: receptionist  Tobacco Use   Smoking status: Former    Packs/day: 0.50    Years: 12.00    Total pack years: 6.00  Types: Cigarettes    Quit date: 02/12/2012    Years since quitting: 10.0    Passive exposure: Never   Smokeless tobacco: Never  Vaping Use   Vaping Use: Never used  Substance and Sexual Activity   Alcohol use: Not Currently   Drug use: Never   Sexual activity: Yes    Partners: Male    Birth control/protection: Pill  Other Topics Concern   Not on file  Social History Narrative   Not on file   Social Determinants of Health   Financial Resource Strain: Not on file  Food Insecurity: Not on file  Transportation Needs: Not on file  Physical Activity: Not on file  Stress: Not on file  Social Connections: Not on file    Past Surgical History:  Procedure Laterality Date   ANKLE SURGERY Right    CHOLECYSTECTOMY     NASAL SEPTUM SURGERY     Past Surgical History:  Procedure Laterality Date   ANKLE SURGERY Right    CHOLECYSTECTOMY     NASAL SEPTUM SURGERY     Past Medical History:  Diagnosis Date   Anxiety    BP 133/85   Pulse (!) 102   Ht 5\' 4"  (1.626 m)   Wt 208 lb 3.2 oz (94.4 kg)   SpO2 98%   BMI 35.74 kg/m   Opioid Risk Score:   Fall Risk Score:  `1  Depression screen [facility] 2/9     02/11/2022   10:07 AM 01/29/2022    1:24 PM 07/01/2021    2:52 PM 10/10/2020    8:23 AM 07/18/2019   10:03 AM 04/19/2019    9:54 AM 03/15/2019    1:50 PM  Depression screen PHQ 2/9  Decreased Interest 0 2 0 1 0 1 1  Down, Depressed, Hopeless 0 1 0 0 0 1 0  PHQ - 2 Score 0 3 0 1 0 2 1  Altered sleeping  3  0  0 1  Tired, decreased energy  2  0  0 2  Change in appetite  2  0  1 0  Feeling bad or failure about yourself   1  0  0 0  Trouble concentrating  1  0  0 0  Moving slowly or fidgety/restless  0  1  0 0  Suicidal thoughts  0  0  0 0  PHQ-9 Score  12  2  3 4   Difficult doing work/chores  Very difficult  Somewhat difficult  Not difficult at all      Review of Systems  Musculoskeletal:        Right ankle pain  All other systems reviewed and are negative.     Objective:   Physical Exam  Gen: no distress, normal appearing HEENT: oral mucosa pink and moist, NCAT Cardio: Reg rate Chest: normal effort, normal rate of breathing Abd: soft, non-distended Ext: no edema Psych: pleasant, normal affect Skin: warm an ddry Neuro: Alert and oriented x3, sensation intact in all 4 extremities Musculoskeletal:  DP/PT palpable, diffuse ankle tenderness with moderate tenderness along medial ankle joint with milder tenderness at lateral ankle joint on the Right. Minimal medial joint swelling. Mild subtalar tenderness. No erythema or warmth. No crepitus or pain with ankle DF or PF. Pain with ankle inversion  and eversion. Left ankle with mild pain along peroneal tendons.  NO brace today Surgical scar well-healed noted on medial right ankle Strength 5/5 in all 4 extremities She does not appear to have  significant joint swelling or tenderness at her hands        Assessment & Plan:  R ankle pain with history of chondral lesion on medial talar dome s/p  right ankle medial malleolus to mid osteochondral repair -Plan to continue tramadol 50mg  as needed -UDS and pain contract today -consider trying TENS   Rheumatoid arthritis -Continue follow-up with rheumatology -She has improved significantly with methotrexate

## 2022-02-19 ENCOUNTER — Telehealth: Payer: Self-pay

## 2022-02-19 LAB — TOXASSURE SELECT,+ANTIDEPR,UR

## 2022-02-19 NOTE — Telephone Encounter (Signed)
Berna Spare call to see back to see when and if you will be sending the pain medication?    If so please send to [facility] on [address].   Thank you

## 2022-02-21 MED ORDER — TRAMADOL HCL 50 MG PO TABS: 50.0000 mg | ORAL_TABLET | Freq: Four times a day (QID) | ORAL | 1 refills | Status: AC | PRN

## 2022-02-21 NOTE — Addendum Note (Signed)
Addended by: Fanny Dance on: 02/21/2022 03:07 PM   Modules accepted: Orders

## 2022-02-22 ENCOUNTER — Telehealth: Payer: Self-pay | Admitting: *Deleted

## 2022-02-22 NOTE — Telephone Encounter (Signed)
Urine drug screen for this encounter is consistent for prescribed medication 

## 2022-03-02 ENCOUNTER — Ambulatory Visit: Payer: 59 | Admitting: Physical Medicine & Rehabilitation

## 2022-03-15 ENCOUNTER — Other Ambulatory Visit: Payer: Self-pay | Admitting: Family Medicine

## 2022-03-15 ENCOUNTER — Other Ambulatory Visit: Payer: Self-pay | Admitting: Physician Assistant

## 2022-03-15 DIAGNOSIS — Z30011 Encounter for initial prescription of contraceptive pills: Secondary | ICD-10-CM

## 2022-03-16 ENCOUNTER — Other Ambulatory Visit (HOSPITAL_COMMUNITY): Payer: Self-pay

## 2022-03-16 ENCOUNTER — Other Ambulatory Visit: Payer: Self-pay | Admitting: Physician Assistant

## 2022-03-16 MED ORDER — NORETHIN ACE-ETH ESTRAD-FE 1-20 MG-MCG PO TABS
1.0000 | ORAL_TABLET | Freq: Every day | ORAL | 0 refills | Status: DC
  Filled 2022-03-16: qty 84, 84d supply, fill #0

## 2022-03-24 ENCOUNTER — Other Ambulatory Visit (HOSPITAL_COMMUNITY): Payer: Self-pay

## 2022-04-08 ENCOUNTER — Ambulatory Visit: Payer: 59 | Admitting: Physical Medicine & Rehabilitation

## 2022-04-19 ENCOUNTER — Other Ambulatory Visit: Payer: Self-pay

## 2022-04-19 DIAGNOSIS — Z30011 Encounter for initial prescription of contraceptive pills: Secondary | ICD-10-CM

## 2022-04-19 MED ORDER — NORETHIN ACE-ETH ESTRAD-FE 1-20 MG-MCG PO TABS: 1.0000 | ORAL_TABLET | Freq: Every day | ORAL | 0 refills | Status: DC

## 2022-07-11 ENCOUNTER — Other Ambulatory Visit: Payer: Self-pay | Admitting: Family Medicine

## 2022-07-11 DIAGNOSIS — Z30011 Encounter for initial prescription of contraceptive pills: Secondary | ICD-10-CM
